# Patient Record
Sex: Male | Born: 1980
Health system: Southern US, Community
[De-identification: ages and names within clinical notes are randomized; demographics above are authoritative.]

## PROBLEM LIST (undated history)

## (undated) DIAGNOSIS — U071 COVID-19: Secondary | ICD-10-CM

## (undated) DIAGNOSIS — K219 Gastro-esophageal reflux disease without esophagitis: Secondary | ICD-10-CM

## (undated) DIAGNOSIS — A071 Giardiasis [lambliasis]: Secondary | ICD-10-CM

## (undated) HISTORY — DX: COVID-19: U07.1

## (undated) HISTORY — PX: PYLOROPLASTY: SHX418

## (undated) HISTORY — PX: UPPER GASTROINTESTINAL ENDOSCOPY: SHX188

## (undated) HISTORY — DX: Giardiasis (lambliasis): A07.1

## (undated) HISTORY — PX: COLONOSCOPY: SHX174

## (undated) HISTORY — PX: NO PAST SURGERIES: SHX2092

## (undated) HISTORY — DX: Gastro-esophageal reflux disease without esophagitis: K21.9

---

## 2019-01-17 ENCOUNTER — Encounter (HOSPITAL_COMMUNITY): Payer: Self-pay | Admitting: Emergency Medicine

## 2019-01-17 ENCOUNTER — Emergency Department (HOSPITAL_COMMUNITY)
Admission: EM | Admit: 2019-01-17 | Discharge: 2019-01-17 | Disposition: A | Discharge status: Home / Self Care | Payer: Self-pay | Attending: Emergency Medicine | Admitting: Emergency Medicine

## 2019-01-17 DIAGNOSIS — J020 Streptococcal pharyngitis: Secondary | ICD-10-CM | POA: Insufficient documentation

## 2019-01-17 DIAGNOSIS — Z20828 Contact with and (suspected) exposure to other viral communicable diseases: Secondary | ICD-10-CM | POA: Insufficient documentation

## 2019-01-17 LAB — LIPASE, BLOOD: Lipase: 28 U/L (ref 11–51)

## 2019-01-17 LAB — COMPREHENSIVE METABOLIC PANEL
ALT: 36 U/L (ref 0–44)
AST: 22 U/L (ref 15–41)
Albumin: 4.4 g/dL (ref 3.5–5.0)
Alkaline Phosphatase: 60 U/L (ref 38–126)
Anion gap: 9 (ref 5–15)
BUN: 14 mg/dL (ref 6–20)
CO2: 27 mmol/L (ref 22–32)
Calcium: 9.4 mg/dL (ref 8.9–10.3)
Chloride: 102 mmol/L (ref 98–111)
Creatinine, Ser: 1.49 mg/dL — ABNORMAL HIGH (ref 0.61–1.24)
GFR calc Af Amer: >60 mL/min (ref >60)
GFR calc non Af Amer: 58 mL/min — ABNORMAL LOW (ref >60)
Glucose, Bld: 90 mg/dL (ref 70–99)
Potassium: 3.9 mmol/L (ref 3.5–5.1)
Sodium: 138 mmol/L (ref 135–145)
Total Bilirubin: 0.6 mg/dL (ref 0.3–1.2)
Total Protein: 8.5 g/dL — ABNORMAL HIGH (ref 6.5–8.1)

## 2019-01-17 LAB — GROUP A STREP BY PCR: Group A Strep by PCR: DETECTED — AB

## 2019-01-17 LAB — CBC
HCT: 50.3 % (ref 39.0–52.0)
Hemoglobin: 16.5 g/dL (ref 13.0–17.0)
MCH: 25.6 pg — ABNORMAL LOW (ref 26.0–34.0)
MCHC: 32.8 g/dL (ref 30.0–36.0)
MCV: 78 fL — ABNORMAL LOW (ref 80.0–100.0)
Platelets: 244 10*3/uL (ref 150–400)
RBC: 6.45 MIL/uL — ABNORMAL HIGH (ref 4.22–5.81)
RDW: 13.4 % (ref 11.5–15.5)
WBC: 14 10*3/uL — ABNORMAL HIGH (ref 4.0–10.5)
nRBC: 0 % (ref 0.0–0.2)

## 2019-01-17 LAB — URINALYSIS, ROUTINE W REFLEX MICROSCOPIC
Bilirubin Urine: NEGATIVE
Glucose, UA: NEGATIVE mg/dL
Ketones, ur: NEGATIVE mg/dL
Leukocytes,Ua: NEGATIVE
Nitrite: NEGATIVE
Protein, ur: 30 mg/dL — AB
Specific Gravity, Urine: 1.02 (ref 1.005–1.030)
pH: 5 (ref 5.0–8.0)

## 2019-01-17 LAB — SARS CORONAVIRUS 2 BY RT PCR (HOSPITAL ORDER, PERFORMED IN ~~LOC~~ HOSPITAL LAB): SARS Coronavirus 2: NEGATIVE

## 2019-01-17 MED ORDER — AZITHROMYCIN 250 MG PO TABS: 250.0000 mg | ORAL_TABLET | Freq: Every day | ORAL | 0 refills | Status: DC

## 2019-01-17 NOTE — Discharge Instructions (Signed)
Please return for any problem.  Follow-up with your regular care provider as instructed.  Drink plenty of fluids.  Cold liquids and foods will help with your throat feel better faster.

## 2019-01-17 NOTE — ED Provider Notes (Signed)
[facility] DEPT Provider Note   CSN: [id number] Arrival date & time: 01/17/19  1244     History   Chief Complaint Chief Complaint  Patient presents with  . Abdominal Pain  . Fever    HPI Jerry Pratt is a 38 y.o. male.     38 year old male with prior medical history as detailed below presents for evaluation of sore throat and fever.  Patient reports onset of symptoms present 48 hours ago.  He denies nausea or vomiting.  He denies shortness of breath or cough.  He reports pain in the posterior throat and mild epigastric discomfort.  He denies urinary symptoms.  He denies change in his bowel movement.  He is not take anything at home for his symptoms.  The history is provided by the patient and medical records. The history is limited by a language barrier. A language interpreter was used.  Fever Temp source:  Subjective Severity:  Mild Onset quality:  Gradual Duration:  2 days Timing:  Constant Progression:  Waxing and waning Chronicity:  New Relieved by:  Nothing Worsened by:  Nothing Ineffective treatments:  None tried Associated symptoms: sore throat   Associated symptoms: no cough and no nausea     History reviewed. No pertinent past medical history.  There are no active problems to display for this patient.   History reviewed. No pertinent surgical history.      Home Medications    Prior to Admission medications   Not on File    Family History No family history on file.  Social History Social History   Tobacco Use  . Smoking status: Not on file  Substance Use Topics  . Alcohol use: Not on file  . Drug use: Not on file     Allergies   Patient has no allergy information on record.   Review of Systems Review of Systems  Constitutional: Positive for fever.  HENT: Positive for sore throat.   Respiratory: Negative for cough.   Gastrointestinal: Negative for nausea.  All other systems reviewed and are  negative.    Physical Exam Updated Vital Signs BP (!) 136/94 (BP Location: Left Arm)   Pulse (!) 121   Temp (!) 101.2 F (38.4 C) (Oral)   Resp 17   SpO2 96%   Physical Exam Vitals signs and nursing note reviewed.  Constitutional:      General: He is not in acute distress.    Appearance: He is well-developed.  HENT:     Head: Normocephalic and atraumatic.     Mouth/Throat:     Mouth: Mucous membranes are moist.     Pharynx: Pharyngeal swelling and oropharyngeal exudate present.     Comments: Mild erythema the posterior pharynx with bilateral tonsillar exudates present.  Uvula midline.  No evidence of PTA. Eyes:     Conjunctiva/sclera: Conjunctivae normal.     Pupils: Pupils are equal, round, and reactive to light.  Neck:     Musculoskeletal: Normal range of motion and neck supple.  Cardiovascular:     Rate and Rhythm: Normal rate and regular rhythm.     Heart sounds: Normal heart sounds.  Pulmonary:     Effort: Pulmonary effort is normal. No respiratory distress.     Breath sounds: Normal breath sounds.  Abdominal:     General: There is no distension.     Palpations: Abdomen is soft.     Tenderness: There is no abdominal tenderness.  Musculoskeletal: Normal range of motion.  General: No deformity.  Skin:    General: Skin is warm and dry.  Neurological:     Mental Status: He is alert and oriented to person, place, and time.      ED Treatments / Results  Labs (all labs ordered are listed, but only abnormal results are displayed) Labs Reviewed  GROUP A STREP BY PCR - Abnormal; Notable for the following components:      Result Value   Group A Strep by PCR DETECTED (*)    All other components within normal limits  COMPREHENSIVE METABOLIC PANEL - Abnormal; Notable for the following components:   Creatinine, Ser 1.49 (*)    Total Protein 8.5 (*)    GFR calc non Af Amer 58 (*)    All other components within normal limits  CBC - Abnormal; Notable for the  following components:   WBC 14.0 (*)    RBC 6.45 (*)    MCV 78.0 (*)    MCH 25.6 (*)    All other components within normal limits  URINALYSIS, ROUTINE W REFLEX MICROSCOPIC - Abnormal; Notable for the following components:   Hgb urine dipstick SMALL (*)    Protein, ur 30 (*)    Bacteria, UA RARE (*)    All other components within normal limits  SARS CORONAVIRUS 2 (HOSPITAL ORDER, [facility])  LIPASE, BLOOD    EKG None  Radiology No results found.  Procedures Procedures (including critical care time)  Medications Ordered in ED Medications - No data to display   Initial Impression / Assessment and Plan / ED Course  I have reviewed the triage vital signs and the nursing notes.  Pertinent labs & imaging results that were available during my care of the patient were reviewed by me and considered in my medical decision making (see chart for details).        MDM  Screen complete  Jerry Pratt was evaluated in Emergency Department on 01/17/2019 for the symptoms described in the history of present illness. He was evaluated in the context of the global COVID-19 pandemic, which necessitated consideration that the patient might be at risk for infection with the SARS-CoV-2 virus that causes COVID-19. Institutional protocols and algorithms that pertain to the evaluation of patients at risk for COVID-19 are in a state of rapid change based on information released by regulatory bodies including the CDC and federal and state organizations. These policies and algorithms were followed during the patient's care in the ED.   Patient is presenting for evaluation of sore throat and fever.  Work-up suggest likely strep pharyngitis with positive rapid strep.  Of note, patient's COVID test is negative.  Other work-up does not reveal significant additional pathology.  Patient does feel improved following his ED evaluation.  He now desires discharge.  Importance of  close follow-up is stressed.  Discharge instructions given in Spanish with translator assistance.  Final Clinical Impressions(s) / ED Diagnoses   Final diagnoses:  Strep pharyngitis    ED Discharge Orders         Ordered    azithromycin (ZITHROMAX) 250 MG tablet  Daily     01/17/19 1737           Valarie Merino, MD 01/17/19 1739

## 2019-01-17 NOTE — ED Triage Notes (Addendum)
Per EMS-states body aches, abdominal pain, fever and sore throat-no SOB-has not been around anyone positive for Covid-spanish speaking-t 101.5, 1000 mg of Tylenol given in route

## 2019-08-21 ENCOUNTER — Emergency Department (HOSPITAL_COMMUNITY)
Admission: EM | Admit: 2019-08-21 | Discharge: 2019-08-21 | Disposition: A | Discharge status: Home Health | Payer: Self-pay | Attending: Emergency Medicine | Admitting: Emergency Medicine

## 2019-08-21 ENCOUNTER — Emergency Department (HOSPITAL_COMMUNITY): Payer: Self-pay

## 2019-08-21 ENCOUNTER — Other Ambulatory Visit: Payer: Self-pay

## 2019-08-21 ENCOUNTER — Encounter (HOSPITAL_COMMUNITY): Payer: Self-pay | Admitting: *Deleted

## 2019-08-21 DIAGNOSIS — W450XXA Nail entering through skin, initial encounter: Secondary | ICD-10-CM | POA: Insufficient documentation

## 2019-08-21 DIAGNOSIS — Z23 Encounter for immunization: Secondary | ICD-10-CM | POA: Insufficient documentation

## 2019-08-21 DIAGNOSIS — M7918 Myalgia, other site: Secondary | ICD-10-CM | POA: Insufficient documentation

## 2019-08-21 DIAGNOSIS — U071 COVID-19: Secondary | ICD-10-CM | POA: Insufficient documentation

## 2019-08-21 DIAGNOSIS — R05 Cough: Secondary | ICD-10-CM | POA: Insufficient documentation

## 2019-08-21 DIAGNOSIS — R1013 Epigastric pain: Secondary | ICD-10-CM | POA: Insufficient documentation

## 2019-08-21 LAB — CBC WITH DIFFERENTIAL/PLATELET
Abs Immature Granulocytes: 0.02 10*3/uL (ref 0.00–0.07)
Basophils Absolute: 0 10*3/uL (ref 0.0–0.1)
Basophils Relative: 1 %
Eosinophils Absolute: 0 10*3/uL (ref 0.0–0.5)
Eosinophils Relative: 0 %
HCT: 51 % (ref 39.0–52.0)
Hemoglobin: 16.8 g/dL (ref 13.0–17.0)
Immature Granulocytes: 0 %
Lymphocytes Relative: 24 %
Lymphs Abs: 1.5 10*3/uL (ref 0.7–4.0)
MCH: 25.5 pg — ABNORMAL LOW (ref 26.0–34.0)
MCHC: 32.9 g/dL (ref 30.0–36.0)
MCV: 77.4 fL — ABNORMAL LOW (ref 80.0–100.0)
Monocytes Absolute: 0.5 10*3/uL (ref 0.1–1.0)
Monocytes Relative: 8 %
Neutro Abs: 4.1 10*3/uL (ref 1.7–7.7)
Neutrophils Relative %: 67 %
Platelets: 200 10*3/uL (ref 150–400)
RBC: 6.59 MIL/uL — ABNORMAL HIGH (ref 4.22–5.81)
RDW: 13.8 % (ref 11.5–15.5)
WBC: 6.1 10*3/uL (ref 4.0–10.5)
nRBC: 0 % (ref 0.0–0.2)

## 2019-08-21 LAB — COMPREHENSIVE METABOLIC PANEL
ALT: 38 U/L (ref 0–44)
AST: 30 U/L (ref 15–41)
Albumin: 4 g/dL (ref 3.5–5.0)
Alkaline Phosphatase: 53 U/L (ref 38–126)
Anion gap: 10 (ref 5–15)
BUN: 12 mg/dL (ref 6–20)
CO2: 24 mmol/L (ref 22–32)
Calcium: 9.4 mg/dL (ref 8.9–10.3)
Chloride: 102 mmol/L (ref 98–111)
Creatinine, Ser: 1.25 mg/dL — ABNORMAL HIGH (ref 0.61–1.24)
GFR calc Af Amer: >60 mL/min (ref >60)
GFR calc non Af Amer: >60 mL/min (ref >60)
Glucose, Bld: 119 mg/dL — ABNORMAL HIGH (ref 70–99)
Potassium: 3.9 mmol/L (ref 3.5–5.1)
Sodium: 136 mmol/L (ref 135–145)
Total Bilirubin: 0.5 mg/dL (ref 0.3–1.2)
Total Protein: 7.9 g/dL (ref 6.5–8.1)

## 2019-08-21 LAB — LACTIC ACID, PLASMA: Lactic Acid, Venous: 1.6 mmol/L (ref 0.5–1.9)

## 2019-08-21 LAB — POC SARS CORONAVIRUS 2 AG: SARS Coronavirus 2 Ag: POSITIVE — AB

## 2019-08-21 MED ORDER — KETOROLAC TROMETHAMINE 30 MG/ML IJ SOLN
15.0000 mg | Freq: Once | INTRAMUSCULAR | Status: AC
  Administered 2019-08-21: 15 mg via INTRAVENOUS
  Filled 2019-08-21: qty 1

## 2019-08-21 MED ORDER — SODIUM CHLORIDE 0.9 % IV BOLUS
1000.0000 mL | Freq: Once | INTRAVENOUS | Status: AC
  Administered 2019-08-21: 1000 mL via INTRAVENOUS

## 2019-08-21 MED ORDER — ACETAMINOPHEN 325 MG PO TABS
650.0000 mg | ORAL_TABLET | Freq: Once | ORAL | Status: AC | PRN
  Administered 2019-08-21: 650 mg via ORAL
  Filled 2019-08-21: qty 2

## 2019-08-21 MED ORDER — SODIUM CHLORIDE 0.9% FLUSH: 3.0000 mL | Freq: Once | INTRAVENOUS | Status: DC

## 2019-08-21 MED ORDER — TETANUS-DIPHTH-ACELL PERTUSSIS 5-2.5-18.5 LF-MCG/0.5 IM SUSP
0.5000 mL | Freq: Once | INTRAMUSCULAR | Status: AC
  Administered 2019-08-21: 0.5 mL via INTRAMUSCULAR
  Filled 2019-08-21: qty 0.5

## 2019-08-21 NOTE — ED Provider Notes (Signed)
[facility] DEPT Provider Note   CSN: [id number] Arrival date & time: 08/21/19  0136     History No chief complaint on file.   Bernett Komara is a 39 y.o. male.  HPI    Patient presents with concern of fever, cough, diffuse discomfort, and epigastric pain. He notes that he is generally well though he does have a history of back disease, notes that he has been identified with disc issues, back in the [place]  He immigrated here about 4 years ago, works as a Theme park manager.  Patient symptoms began about 1 week ago, about the time he stepped on a nail.  He has no persistent shakes, no rigor, last tetanus may have been about 6 years ago. He now presents due to 1 week of ongoing fever, cough discomfort, epigastric pain as above.  There is no vomiting, no other abdominal pain.  Patient has had some improvement with Tylenol, though it is transient improvement of his fever more than anything.  History reviewed. No pertinent past medical history.  There are no problems to display for this patient.   History reviewed. No pertinent surgical history.     No family history on file.  Social History   Tobacco Use  . Smoking status: Not on file  Substance Use Topics  . Alcohol use: Not on file  . Drug use: Not on file    Home Medications Prior to Admission medications   Medication Sig Start Date End Date Taking? Authorizing Provider  azithromycin (ZITHROMAX) 250 MG tablet Take 1 tablet (250 mg total) by mouth daily. Take first 2 tablets together, then 1 every day until finished. 01/17/19   Valarie Merino, MD    Allergies    Patient has no allergy information on record.  Review of Systems   Review of Systems  Constitutional:       Per HPI, otherwise negative  HENT:       Per HPI, otherwise negative  Respiratory:       Per HPI, otherwise negative  Cardiovascular:       Per HPI, otherwise negative  Gastrointestinal: Negative for vomiting.    Endocrine:       Negative aside from HPI  Genitourinary:       Neg aside from HPI   Musculoskeletal:       Per HPI, otherwise negative  Skin: Negative.   Neurological: Negative for syncope.    Physical Exam Updated Vital Signs BP (!) 138/107 (BP Location: Right Arm)   Pulse 99   Temp (!) 100.4 F (38 C) (Oral)   Resp (!) 22   SpO2 96%   Physical Exam Vitals and nursing note reviewed.  Constitutional:      General: He is not in acute distress.    Appearance: He is well-developed.  HENT:     Head: Normocephalic and atraumatic.  Eyes:     Conjunctiva/sclera: Conjunctivae normal.  Cardiovascular:     Rate and Rhythm: Normal rate and regular rhythm.  Pulmonary:     Effort: Pulmonary effort is normal. No respiratory distress.     Breath sounds: No stridor.  Abdominal:     General: There is no distension.  Skin:    General: Skin is warm and dry.  Neurological:     Mental Status: He is alert and oriented to person, place, and time.     ED Results / Procedures / Treatments   Labs (all labs ordered are listed, but only abnormal  results are displayed) Labs Reviewed  COMPREHENSIVE METABOLIC PANEL - Abnormal; Notable for the following components:      Result Value   Glucose, Bld 119 (*)    Creatinine, Ser 1.25 (*)    All other components within normal limits  CBC WITH DIFFERENTIAL/PLATELET - Abnormal; Notable for the following components:   RBC 6.59 (*)    MCV 77.4 (*)    MCH 25.5 (*)    All other components within normal limits  POC SARS CORONAVIRUS 2 AG - Abnormal; Notable for the following components:   SARS Coronavirus 2 Ag POSITIVE (*)    All other components within normal limits  LACTIC ACID, PLASMA  URINALYSIS, ROUTINE W REFLEX MICROSCOPIC  POC SARS CORONAVIRUS 2 AG -  ED    Radiology DG Chest Portable 1 View  Result Date: 08/21/2019 CLINICAL DATA:  Cough and fever for 3 days EXAM: PORTABLE CHEST 1 VIEW COMPARISON:  None. FINDINGS: Single frontal view of  the chest demonstrates an unremarkable cardiac silhouette. Lung volumes are slightly diminished. No airspace disease, effusion, or pneumothorax. No acute bony abnormality. IMPRESSION: 1. Low lung volumes, no acute process. Electronically Signed   By: Randa Ngo M.D.   On: 08/21/2019 02:18    Procedures Procedures (including critical care time)  Medications Ordered in ED Medications  sodium chloride 0.9 % bolus 1,000 mL (has no administration in time range)  ketorolac (TORADOL) 30 MG/ML injection 15 mg (has no administration in time range)  Tdap (BOOSTRIX) injection 0.5 mL (has no administration in time range)  acetaminophen (TYLENOL) tablet 650 mg (650 mg Oral Given 08/21/19 0232)    ED Course  I have reviewed the triage vital signs and the nursing notes.  Pertinent labs & imaging results that were available during my care of the patient were reviewed by me and considered in my medical decision making (see chart for details).   With consideration of pneumonia, SIRS, sepsis, tetanus, labs, IV fluids, Toradol, tetanus updating all ordered after my initial evaluation.  Update: Patient in no distress, findings discussed again.  MDM Rules/Calculators/A&P This generally well 39 year old male presents with signs and symptoms consistent with Covid infection. Patient is awake and alert, but has initial tachycardia, is found to have slight elevated creatinine, consistent with ongoing infection.  No new oxygen requirement, no indication for admission.  In addition to the patient's coronavirus infection, there is some concern for his systemic and male, though no evidence for tetanus.  Patient did receive updating for this as well. Final Clinical Impression(s) / ED Diagnoses Final diagnoses:  COVID-19 virus infection     Carmin Muskrat, MD 08/21/19 1112

## 2019-08-21 NOTE — ED Triage Notes (Signed)
Via spanish interpreter. Pt says that he stepped on a nail at work (last Tuesday), and just after that he started with a fever and pain all over. 3 days ago, he has been feeling worse, with fevers, back pain,cough, headache. Has been taking tylenol. Denies sick contacts.

## 2019-08-21 NOTE — ED Triage Notes (Signed)
Pt arrives via [facility] from home. Stepped on a nail with his left foot, one week ago. Now having fever, body aches and chills. No obvious injury, swelling or redness to the foot. Limited English. A/O x 4. 160palpated, hr 110, sp 97% RA, CBG 143, temp 98.2.

## 2019-08-28 ENCOUNTER — Telehealth: Payer: Self-pay | Admitting: *Deleted

## 2019-08-28 NOTE — Telephone Encounter (Signed)
Patient calls with Spanish interpreter on the line. He was tested Covid positive on 08/21/19 but had symptoms 9 days prior to that date. He no longer has symptoms other than an "occasional cough". He would like to be retested to know he no longer can spread the virus. Provided the patient with the testing site address and # to schedule an appointment.

## 2020-04-25 ENCOUNTER — Emergency Department (HOSPITAL_COMMUNITY): Payer: No Typology Code available for payment source

## 2020-04-25 ENCOUNTER — Encounter (HOSPITAL_COMMUNITY): Payer: Self-pay | Admitting: Emergency Medicine

## 2020-04-25 ENCOUNTER — Emergency Department (HOSPITAL_COMMUNITY)
Admission: EM | Admit: 2020-04-25 | Discharge: 2020-04-26 | Disposition: A | Discharge status: Home / Self Care | Payer: No Typology Code available for payment source | Attending: Emergency Medicine | Admitting: Emergency Medicine

## 2020-04-25 ENCOUNTER — Other Ambulatory Visit: Payer: Self-pay

## 2020-04-25 DIAGNOSIS — R519 Headache, unspecified: Secondary | ICD-10-CM | POA: Diagnosis not present

## 2020-04-25 DIAGNOSIS — M25522 Pain in left elbow: Secondary | ICD-10-CM | POA: Diagnosis not present

## 2020-04-25 DIAGNOSIS — M25552 Pain in left hip: Secondary | ICD-10-CM | POA: Diagnosis present

## 2020-04-25 DIAGNOSIS — M545 Low back pain, unspecified: Secondary | ICD-10-CM | POA: Insufficient documentation

## 2020-04-25 DIAGNOSIS — Y92410 Unspecified street and highway as the place of occurrence of the external cause: Secondary | ICD-10-CM | POA: Insufficient documentation

## 2020-04-25 DIAGNOSIS — M79632 Pain in left forearm: Secondary | ICD-10-CM | POA: Insufficient documentation

## 2020-04-25 MED ORDER — OXYCODONE-ACETAMINOPHEN 5-325 MG PO TABS
1.0000 | ORAL_TABLET | Freq: Once | ORAL | Status: AC
  Administered 2020-04-25: 1 via ORAL
  Filled 2020-04-25: qty 1
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
## 2020-04-26 MED ORDER — IBUPROFEN 800 MG PO TABS
800.0000 mg | ORAL_TABLET | Freq: Once | ORAL | Status: AC
  Administered 2020-04-26: 04:00:00 800 mg via ORAL
  Filled 2020-04-26: qty 1

## 2020-04-26 NOTE — Discharge Instructions (Signed)
You may alternate Tylenol 1000 mg every 6 hours as needed for pain, fever and Ibuprofen 800 mg every 8 hours as needed for pain, fever.  Please take Ibuprofen with food.  Do not take more than 4000 mg of Tylenol (acetaminophen) in a 24 hour period.   Steps to find a Primary Care Provider (PCP):  Call [phone] or [phone] to access "[facility]."  2.  You may also go on the [facility] website at [email or web address]  3.  [facility] also frequently accepts new patients.  [place] [phone]  4.  There are also multiple [facility], [facility] and [facility] practices throughout the [place] that are frequently accepting new patients. You may find a clinic that is close to your home and contact them.  [email or web address] [phone]  [facility] [email or web address]  [email or web address] [place] [email or web address] [phone]  5.  Local Health Departments also can provide primary care services.  [facility], [place] [address] [place] [facility] [place] [address] [place] Department [place] [phone]
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
## 2020-07-15 NOTE — Progress Notes (Signed)
Opened in error - please disregard

## 2021-02-18 ENCOUNTER — Other Ambulatory Visit: Payer: Self-pay

## 2021-02-18 ENCOUNTER — Ambulatory Visit
Admission: RE | Admit: 2021-02-18 | Discharge: 2021-02-18 | Disposition: A | Discharge status: Home / Self Care | Payer: 59 | Source: Ambulatory Visit | Attending: Internal Medicine | Admitting: Internal Medicine

## 2021-02-18 VITALS — BP 152/105 | HR 81 | Temp 98.0°F | Resp 18 | Ht 71.0 in | Wt 220.0 lb

## 2021-02-18 DIAGNOSIS — K219 Gastro-esophageal reflux disease without esophagitis: Secondary | ICD-10-CM

## 2021-02-18 MED ORDER — OMEPRAZOLE 20 MG PO CPDR: 20.0000 mg | DELAYED_RELEASE_CAPSULE | Freq: Every day | ORAL | 0 refills | Status: DC

## 2021-02-18 MED ORDER — FAMOTIDINE 20 MG PO TABS: 20.0000 mg | ORAL_TABLET | Freq: Every day | ORAL | 0 refills | Status: DC

## 2021-02-18 NOTE — ED Triage Notes (Signed)
Patient c/o stomach pain that causes a burning sensation in his chest and throat for several months.  Patient is taken Omeprazole for the sx's, some relief.  Some nausea and some vomiting.

## 2021-02-18 NOTE — Discharge Instructions (Addendum)
You have been prescribed two daily medications to help alleviate symptoms.  Please follow-up with provided contact information for gastroenterology specialist for further evaluation and management.

## 2021-02-18 NOTE — ED Provider Notes (Signed)
[facility]    CSN: [id number] Arrival date & time: 02/18/21  1507      History   Chief Complaint Chief Complaint  Patient presents with   Appointment    Stomach Pain    HPI Jerry Pratt is a 40 y.o. male.   Patient presents with stomach pain that has been intermittent for the past 2 to 3 months.  Pain is described as burning sensation that occurs at the top of the throat and radiates down to the epigastric area.  Pain is intermittent and is not severe.  Patient denies nausea, vomiting, diarrhea.  Denies any blood in stool.  Patient having regular bowel movements and states that he has a bowel movement every time he consumes a meal.  Denies any fevers.  Patient reports that he has been taking omeprazole as needed that provides relief of symptoms.  Denies weight loss or loss of appetite.    History reviewed. No pertinent past medical history.  There are no problems to display for this patient.   History reviewed. No pertinent surgical history.     Home Medications    Prior to Admission medications   Medication Sig Start Date End Date Taking? Authorizing Provider  famotidine (PEPCID) 20 MG tablet Take 1 tablet (20 mg total) by mouth at bedtime. 02/18/21  Yes Odis Luster, FNP  omeprazole (PRILOSEC) 20 MG capsule Take 1 capsule (20 mg total) by mouth daily. 02/18/21  Yes Odis Luster, FNP  azithromycin (ZITHROMAX) 250 MG tablet Take 1 tablet (250 mg total) by mouth daily. Take first 2 tablets together, then 1 every day until finished. 01/17/19   Valarie Merino, MD    Family History No family history on file.  Social History Social History   Tobacco Use   Smoking status: Never   Smokeless tobacco: Never  Substance Use Topics   Alcohol use: Never   Drug use: Never     Allergies   Patient has no known allergies.   Review of Systems Review of Systems Per HPI  Physical Exam Triage Vital Signs ED Triage Vitals  Enc Vitals Group     BP  02/18/21 1545 (!) 152/105     Pulse Rate 02/18/21 1545 81     Resp 02/18/21 1545 18     Temp 02/18/21 1545 98 F (36.7 C)     Temp Source 02/18/21 1545 Oral     SpO2 02/18/21 1545 97 %     Weight 02/18/21 1547 220 lb (99.8 kg)     Height 02/18/21 1547 5\' 11"  (1.803 m)     Head Circumference --      Peak Flow --      Pain Score 02/18/21 1546 6     Pain Loc --      Pain Edu? --      Excl. in [place]? --    No data found.  Updated Vital Signs BP (!) 152/105 (BP Location: Left Arm)   Pulse 81   Temp 98 F (36.7 C) (Oral)   Resp 18   Ht 5\' 11"  (1.803 m)   Wt 220 lb (99.8 kg)   SpO2 97%   BMI 30.68 kg/m   Visual Acuity Right Eye Distance:   Left Eye Distance:   Bilateral Distance:    Right Eye Near:   Left Eye Near:    Bilateral Near:     Physical Exam Constitutional:      General: He is not in acute distress.  Appearance: Normal appearance. He is not toxic-appearing or diaphoretic.  HENT:     Head: Normocephalic and atraumatic.  Eyes:     Extraocular Movements: Extraocular movements intact.     Conjunctiva/sclera: Conjunctivae normal.  Cardiovascular:     Rate and Rhythm: Normal rate and regular rhythm.     Pulses: Normal pulses.     Heart sounds: Normal heart sounds.  Pulmonary:     Effort: Pulmonary effort is normal. No respiratory distress.     Breath sounds: Normal breath sounds.  Abdominal:     General: Bowel sounds are normal. There is no distension.     Palpations: Abdomen is soft.     Tenderness: There is abdominal tenderness in the epigastric area. There is guarding. Negative signs include Murphy's sign, Rovsing's sign, McBurney's sign, psoas sign and obturator sign.  Skin:    General: Skin is warm and dry.  Neurological:     General: No focal deficit present.     Mental Status: He is alert and oriented to person, place, and time. Mental status is at baseline.  Psychiatric:        Mood and Affect: Mood normal.        Behavior: Behavior normal.         Thought Content: Thought content normal.        Judgment: Judgment normal.     UC Treatments / Results  Labs (all labs ordered are listed, but only abnormal results are displayed) Labs Reviewed - No data to display  EKG   Radiology No results found.  Procedures Procedures (including critical care time)  Medications Ordered in UC Medications - No data to display  Initial Impression / Assessment and Plan / UC Course  I have reviewed the triage vital signs and the nursing notes.  Pertinent labs & imaging results that were available during my care of the patient were reviewed by me and considered in my medical decision making (see chart for details).     Patient symptoms and physical exam seem most consistent with acid reflux.  Advised patient that omeprazole will need to be taken daily for preventive medication.  Patient prescribed omeprazole 20 mg daily.  Also prescribed famotidine to take at night at bedtime to help alleviate symptoms as well.  Patient to avoid foods that are irritating.  Patient to follow-up with provided contact information for GI specialist for further evaluation and management.  PCP assistance requested for patient as well due to patient request.Discussed strict return precautions. Patient verbalized understanding and is agreeable with plan.  Final Clinical Impressions(s) / UC Diagnoses   Final diagnoses:  Gastroesophageal reflux disease, unspecified whether esophagitis present     Discharge Instructions      You have been prescribed two daily medications to help alleviate symptoms.  Please follow-up with provided contact information for gastroenterology specialist for further evaluation and management.     ED Prescriptions     Medication Sig Dispense Auth. Provider   omeprazole (PRILOSEC) 20 MG capsule Take 1 capsule (20 mg total) by mouth daily. 90 capsule Odis Luster, FNP   famotidine (PEPCID) 20 MG tablet Take 1 tablet (20 mg total) by  mouth at bedtime. 30 tablet Odis Luster, FNP      PDMP not reviewed this encounter.   Odis Luster, [place] 02/18/21 [phone]

## 2021-03-01 ENCOUNTER — Ambulatory Visit (INDEPENDENT_AMBULATORY_CARE_PROVIDER_SITE_OTHER): Payer: 59 | Admitting: Gastroenterology

## 2021-03-01 ENCOUNTER — Other Ambulatory Visit (INDEPENDENT_AMBULATORY_CARE_PROVIDER_SITE_OTHER): Payer: 59

## 2021-03-01 ENCOUNTER — Encounter: Payer: Self-pay | Admitting: Gastroenterology

## 2021-03-01 VITALS — BP 120/90 | HR 92 | Ht 68.0 in | Wt 218.5 lb

## 2021-03-01 DIAGNOSIS — K625 Hemorrhage of anus and rectum: Secondary | ICD-10-CM

## 2021-03-01 DIAGNOSIS — K529 Noninfective gastroenteritis and colitis, unspecified: Secondary | ICD-10-CM

## 2021-03-01 DIAGNOSIS — K219 Gastro-esophageal reflux disease without esophagitis: Secondary | ICD-10-CM

## 2021-03-01 DIAGNOSIS — R718 Other abnormality of red blood cells: Secondary | ICD-10-CM

## 2021-03-01 DIAGNOSIS — R1011 Right upper quadrant pain: Secondary | ICD-10-CM

## 2021-03-01 DIAGNOSIS — G8929 Other chronic pain: Secondary | ICD-10-CM

## 2021-03-01 LAB — COMPREHENSIVE METABOLIC PANEL
ALT: 31 U/L (ref 0–53)
AST: 32 U/L (ref 0–37)
Albumin: 4.2 g/dL (ref 3.5–5.2)
Alkaline Phosphatase: 53 U/L (ref 39–117)
BUN: 13 mg/dL (ref 6–23)
CO2: 28 mEq/L (ref 19–32)
Calcium: 9.3 mg/dL (ref 8.4–10.5)
Chloride: 103 mEq/L (ref 96–112)
Creatinine, Ser: 1.29 mg/dL (ref 0.40–1.50)
GFR: 68.26 mL/min (ref >60.00)
Glucose, Bld: 85 mg/dL (ref 70–99)
Potassium: 3.4 mEq/L — ABNORMAL LOW (ref 3.5–5.1)
Sodium: 139 mEq/L (ref 135–145)
Total Bilirubin: 0.6 mg/dL (ref 0.2–1.2)
Total Protein: 7.3 g/dL (ref 6.0–8.3)

## 2021-03-01 LAB — CBC WITH DIFFERENTIAL/PLATELET
Basophils Absolute: 0.1 10*3/uL (ref 0.0–0.1)
Basophils Relative: 1.1 % (ref 0.0–3.0)
Eosinophils Absolute: 0.3 10*3/uL (ref 0.0–0.7)
Eosinophils Relative: 2.9 % (ref 0.0–5.0)
HCT: 46.7 % (ref 39.0–52.0)
Hemoglobin: 15.4 g/dL (ref 13.0–17.0)
Lymphocytes Relative: 36.8 % (ref 12.0–46.0)
Lymphs Abs: 3.3 10*3/uL (ref 0.7–4.0)
MCHC: 33 g/dL (ref 30.0–36.0)
MCV: 77.8 fl — ABNORMAL LOW (ref 78.0–100.0)
Monocytes Absolute: 1 10*3/uL (ref 0.1–1.0)
Monocytes Relative: 11.3 % (ref 3.0–12.0)
Neutro Abs: 4.3 10*3/uL (ref 1.4–7.7)
Neutrophils Relative %: 47.9 % (ref 43.0–77.0)
Platelets: 248 10*3/uL (ref 150.0–400.0)
RBC: 6 Mil/uL — ABNORMAL HIGH (ref 4.22–5.81)
RDW: 13.6 % (ref 11.5–15.5)
WBC: 8.9 10*3/uL (ref 4.0–10.5)

## 2021-03-01 LAB — LIPASE: Lipase: 43 U/L (ref 11.0–59.0)

## 2021-03-01 NOTE — Patient Instructions (Signed)
It was a pleasure to meet you today. I have recommended:  - Please stop by the lab for testing today - Increase your omeprazole to 40 mg twice daily - You may continue to use your famotidine 20 mg at bedtime if needed - I gave you a brochure about ways to minimize reflux - Avoid foods that may trigger your symptoms - Schedule an upper endoscopy and colonoscopy for further evaluation - I will refer you to a primary care provider to establish care  LABS:   Please proceed to the basement level for lab work before leaving today. Press "B" on the elevator. The lab is located at the first door on the left as you exit the elevator.  HEALTHCARE LAWS AND MY CHART RESULTS:   Due to recent changes in healthcare laws, you may see results of your imaging and/or laboratory studies on MyChart before I have had a chance to review them.  I understand that in some cases there may be results that are confusing or concerning to you. Please understand that not all results are received at same time and often I may need to interpret multiple results in order to provide you with the best plan of care or course of treatment. Therefore, I ask that you please give me 48 hours to thoroughly review all your results before contacting my office for clarification.    COLONOSCOPY AND ENDOSCOPY:   You have been scheduled for an endoscopy and a colonoscopy. Please follow the written instructions given to you at your visit today.  PREP:   Please pick up your prep supplies at the pharmacy within the next 1-3 days.  INHALERS:   If you use inhalers (even only as needed), please bring them with you on the day of your procedure.   COLONOSCOPY TIPS:  To reduce nausea and dehydration, stay well hydrated for 3-4 days prior to the exam.  To prevent skin/hemorrhoid irritation - prior to wiping, put A&Dointment or vaseline on the toilet paper. Keep a towel or pad on the bed.  BEFORE STARTING YOUR PREP, drink  64oz of clear  liquids in the morning. This will help to flush the colon and will ensure you are well hydrated!!!!  NOTE - This is in addition to the fluids required for to complete your prep. Use of a flavored hard candy, such as grape Anise Salvo, can counteract some of the flavor of the prep and may prevent some nausea.  It was my pleasure to provide care to you today. Based on our discussion, I am providing you with my recommendations below:  RECOMMENDATION(S):    FOLLOW UP:  After your procedure, you will receive a call from my office staff regarding my recommendation for follow up.  BMI:  If you are age 51 or older, your body mass index should be between 23-30. Your Body mass index is 33.22 kg/m. If this is out of the aforementioned range listed, please consider follow up with your Primary Care Provider.  If you are age 71 or younger, your body mass index should be between 19-25. Your Body mass index is 33.22 kg/m. If this is out of the aformentioned range listed, please consider follow up with your Primary Care Provider.   MY CHART:  The [facility] providers would like to encourage you to use [facility] to communicate with providers for non-urgent requests or questions.  Due to long hold times on the telephone, sending your provider a message by [facility] may be a faster  and more efficient way to get a response.  Please allow 48 business hours for a response.  Please remember that this is for non-urgent requests.   Thank you for trusting me with your gastrointestinal care!    Thornton Park, MD, MPH

## 2021-03-01 NOTE — Progress Notes (Signed)
Referring Provider: No ref. provider found Primary Care Physician:  Patient, No Pcp Per (Inactive)   Reason for Consultation:  reflux, abdominal pain   IMPRESSION:  Upper abdominal pain Reflux with associated globus Postprandial diarrhea - having 7 bowel movements daily Bright red blood noted on the toilet paper Elevated creatinine on last labs in April 2021 Microcytosis without anemia on labs in April 2021 Family history of stomach issues ? Gastric cancer (mother)  The differential for upper abdominal pain with associated reflux and post-prandial diarrhea includes esophagitis, gastritis, H pylori, gastroparesis, duodenitis, functional abdominal pain, SIBO, celiac, IBD, pancreatic and/or hepatobiliary disorders. The association with stress makes increases the likelihood for a component that is function.   PLAN: - CMP, lipase and CBC - Increase omeprazole to 40 mg BID - Continue famotidine 20 mg nightly - Reflux lifestyle modifications - EGD with biopsies and colonoscopy with evaluation of the TI - Abdominal imaging if endoscopic evaluation is negative - Establish care with primary care - ? Depression  I consented the patient at the bedside today discussing the risks, benefits, and alternatives to endoscopic evaluation. He acknowledged these risks and asked that we proceed.    HPI: Jerry Pratt is a 40 y.o. male referred by the urgent care for abdominal pain.  The history is obtained to the patient with the help of interpreter and review of his electronic health record. He does not have a primary care provider. 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   Seen in the Urgent Care 02/19/2020 with a years-long history of intermittent stomach pain described as a burning sensation that occurs at the top of the throat radiating down to the epigastrium, a fullness in the throat, and upper abdominal pain.  No labs or imaging were performed at that time of the Urgent Care  visit.  He was discharged on omeprazole 20 mg daily and famotidine 20 mg nightly. Symptoms are unchanged despite 100% adherence with that medical regimen.   He is also reporting many years of postprandial diarrhea and the new onset of intermittent BRB on the toilet paper.  He will have a bowel movement before he even finishes eating. He is having 7-8 bowel movements daily. 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   No evidence for GI bleeding, iron deficiency anemia, anorexia, unexplained weight loss, dysphagia, odynophagia, persistent vomiting, or gastrointestinal cancer in a first-degree relative.   He notes some personal issues and some work issues. Stress may be worsening his symptoms.   Uses Tylenol. No NSAIDs. Recently on an "antibiotic"  for the flu that gets from [employer].   Most recent labs from 08/2019 show an abnormal CMP with a creatinine of 1.25 and a glucose of 119, MCV 77.4, hemoglobin 16.8, RDW 13.8, platelets 200  No prior abdominal imaging.  No prior endoscopic evaluation.  Mother died of stomach related problems. No family history of gastrointestinal cancer in a first-degree relative.  Past Medical History:  Diagnosis Date   COVID-19    GERD (gastroesophageal reflux disease)    Giardia     Past Surgical History:  Procedure Laterality Date   NO PAST SURGERIES       Current Outpatient Medications  Medication Sig Dispense Refill   famotidine (PEPCID) 20 MG tablet Take 1 tablet (20 mg total) by mouth at bedtime. 30 tablet 0   omeprazole (PRILOSEC) 20 MG capsule Take 1 capsule (20 mg total) by mouth daily. 90 capsule 0   No current facility-administered  medications for this visit.    Allergies as of 03/01/2021   (No Known Allergies)     Review of Systems: 12 system ROS is negative except as noted above.   Physical Exam: General:   Alert,  well-nourished, pleasant and cooperative in NAD Head:  Normocephalic and atraumatic. Eyes:  Sclera clear,  no icterus.   Conjunctiva pink. Ears:  Normal auditory acuity. Nose:  No deformity, discharge,  or lesions. Mouth:  No deformity or lesions.   Neck:  Supple; no masses or thyromegaly. Lungs:  Clear throughout to auscultation.   No wheezes. Heart:  Regular rate and rhythm; no murmurs. Abdomen:  Soft, mild upper abdominal pain, nondistended, normal bowel sounds, no rebound or guarding. No hepatosplenomegaly.   Rectal:  Deferred  Msk:  Symmetrical. No boney deformities LAD: No inguinal or umbilical LAD Extremities:  No clubbing or edema. Neurologic:  Alert and  oriented x4;  grossly nonfocal Skin:  Intact without significant lesions or rashes. Psych:  Alert and cooperative. Normal mood and affect.    Khyla Mccumbers L. Tarri Glenn, MD, MPH 03/01/2021, 8:11 PM

## 2021-03-02 ENCOUNTER — Other Ambulatory Visit: Payer: Self-pay

## 2021-03-02 DIAGNOSIS — E876 Hypokalemia: Secondary | ICD-10-CM

## 2021-03-02 DIAGNOSIS — K625 Hemorrhage of anus and rectum: Secondary | ICD-10-CM

## 2021-03-02 DIAGNOSIS — R718 Other abnormality of red blood cells: Secondary | ICD-10-CM

## 2021-03-02 MED ORDER — POTASSIUM CHLORIDE CRYS ER 10 MEQ PO TBCR: 10.0000 meq | EXTENDED_RELEASE_TABLET | Freq: Two times a day (BID) | ORAL | 0 refills | Status: DC

## 2021-03-03 ENCOUNTER — Other Ambulatory Visit: Payer: Self-pay | Admitting: Gastroenterology

## 2021-03-03 DIAGNOSIS — E876 Hypokalemia: Secondary | ICD-10-CM

## 2021-04-08 ENCOUNTER — Ambulatory Visit (AMBULATORY_SURGERY_CENTER): Payer: 59 | Admitting: Gastroenterology

## 2021-04-08 ENCOUNTER — Encounter: Payer: Self-pay | Admitting: Gastroenterology

## 2021-04-08 VITALS — BP 113/77 | HR 95 | Temp 98.3°F | Resp 23 | Ht 68.0 in | Wt 218.0 lb

## 2021-04-08 DIAGNOSIS — D128 Benign neoplasm of rectum: Secondary | ICD-10-CM | POA: Diagnosis not present

## 2021-04-08 DIAGNOSIS — K625 Hemorrhage of anus and rectum: Secondary | ICD-10-CM | POA: Diagnosis not present

## 2021-04-08 DIAGNOSIS — K219 Gastro-esophageal reflux disease without esophagitis: Secondary | ICD-10-CM | POA: Diagnosis not present

## 2021-04-08 DIAGNOSIS — K5730 Diverticulosis of large intestine without perforation or abscess without bleeding: Secondary | ICD-10-CM | POA: Diagnosis not present

## 2021-04-08 DIAGNOSIS — R1011 Right upper quadrant pain: Secondary | ICD-10-CM | POA: Diagnosis not present

## 2021-04-08 DIAGNOSIS — R197 Diarrhea, unspecified: Secondary | ICD-10-CM

## 2021-04-08 DIAGNOSIS — D122 Benign neoplasm of ascending colon: Secondary | ICD-10-CM

## 2021-04-08 DIAGNOSIS — K209 Esophagitis, unspecified without bleeding: Secondary | ICD-10-CM

## 2021-04-08 DIAGNOSIS — G8929 Other chronic pain: Secondary | ICD-10-CM

## 2021-04-08 DIAGNOSIS — B9681 Helicobacter pylori [H. pylori] as the cause of diseases classified elsewhere: Secondary | ICD-10-CM

## 2021-04-08 DIAGNOSIS — R718 Other abnormality of red blood cells: Secondary | ICD-10-CM

## 2021-04-08 DIAGNOSIS — K529 Noninfective gastroenteritis and colitis, unspecified: Secondary | ICD-10-CM

## 2021-04-08 DIAGNOSIS — K2951 Unspecified chronic gastritis with bleeding: Secondary | ICD-10-CM | POA: Diagnosis not present

## 2021-04-08 MED ORDER — SODIUM CHLORIDE 0.9 % IV SOLN: 500.0000 mL | Freq: Once | INTRAVENOUS | Status: DC

## 2021-04-08 NOTE — Progress Notes (Signed)
To pacu, VSS. Report to [email or web address] 

## 2021-04-08 NOTE — Op Note (Signed)
[facility] Patient Name: Jerry Pratt Procedure Date: 04/08/2021 2:30 PM MRN: [id number] Endoscopist: Thornton Park MD, MD Age: 40 Referring MD:  Date of Birth: Apr 21, 1981 Gender: Male Account #: [id number] Procedure:                Colonoscopy Indications:              Upper abdominal pain                           Postprandial diarrhea - having 7 bowel movements                            daily                           Bright red blood noted on the toilet paper                           Microcytosis without anemia on labs in April 2021 Medicines:                Monitored Anesthesia Care Procedure:                Pre-Anesthesia Assessment:                           - Prior to the procedure, a History and Physical                            was performed, and patient medications and                            allergies were reviewed. The patient's tolerance of                            previous anesthesia was also reviewed. The risks                            and benefits of the procedure and the sedation                            options and risks were discussed with the patient.                            All questions were answered, and informed consent                            was obtained. Prior Anticoagulants: The patient has                            taken no previous anticoagulant or antiplatelet                            agents. ASA Grade Assessment: II - A patient with  mild systemic disease. After reviewing the risks                            and benefits, the patient was deemed in                            satisfactory condition to undergo the procedure.                           After obtaining informed consent, the colonoscope                            was passed under direct vision. Throughout the                            procedure, the patient's blood pressure, pulse, and                            oxygen  saturations were monitored continuously. The                            CF [id number] #[id number] was introduced through the anus                            and advanced to the 3 cm into the ileum. A second                            forward view of the right colon was performed. The                            colonoscopy was performed without difficulty. The                            patient tolerated the procedure well. The quality                            of the bowel preparation was good. The terminal                            ileum, ileocecal valve, appendiceal orifice, and                            rectum were photographed. Scope In: 2:59:00 PM Scope Out: 3:12:35 PM Scope Withdrawal Time: 0 hours 12 minutes 1 second  Total Procedure Duration: 0 hours 13 minutes 35 seconds  Findings:                 The perianal and digital rectal examinations were                            normal except for internal hemorrhoids.                           A 2 mm polyp was found in the  rectum. The polyp was                            sessile. The polyp was removed with a cold snare.                            Resection and retrieval were complete. Estimated                            blood loss was minimal.                           A 2 mm polyp was found in the ascending colon. The                            polyp was flat. The polyp was removed with a cold                            snare. Resection and retrieval were complete.                            Estimated blood loss was minimal.                           Multiple small and large-mouthed diverticula were                            found in the sigmoid colon and descending colon.                           The remainder of the examined colonic mucosa                            appeared normal. Biopsies were taken from the right                            colong and left colon with a cold forceps for                            histology.  Estimated blood loss was minimal.                           The terminal ileum appeared normal. Complications:            No immediate complications. Estimated blood loss:                            Minimal. Estimated Blood Loss:     Estimated blood loss was minimal. Impression:               - Internal hemorrhoids.                           - One 2 mm polyp in the rectum, removed with a cold  snare. Resected and retrieved.                           - One 2 mm polyp in the ascending colon, removed                            with a cold snare. Resected and retrieved.                           - The entire examined colon is normal. Biopsied.                           - The examined portion of the ileum was normal.                           - The examination was otherwise normal on direct                            and retroflexion views. Recommendation:           - Patient has a contact number available for                            emergencies. The signs and symptoms of potential                            delayed complications were discussed with the                            patient. Return to normal activities tomorrow.                            Written discharge instructions were provided to the                            patient.                           - Resume previous diet. High fiber diet recommended.                           - Continue present medications.                           - Await pathology results.                           - Repeat colonoscopy date to be determined after                            pending pathology results are reviewed for                            surveillance.                           -  Emerging evidence supports eating a diet of                            fruits, vegetables, grains, calcium, and yogurt                            while reducing red meat and alcohol may reduce the                            risk of  colon cancer.                           - Thank you for allowing me to be involved in your                            colon cancer prevention. Thornton Park MD, MD 04/08/2021 3:19:12 PM This report has been signed electronically.

## 2021-04-08 NOTE — Patient Instructions (Signed)
Handouts on polyps given to patient. Await pathology results. Resume previous diet and continue present medications. Recommended repeat colonoscopy for screening purposes will be determined based off of pathology results.   YOU HAD AN ENDOSCOPIC PROCEDURE TODAY AT [facility]:   Refer to the procedure report that was given to you for any specific questions about what was found during the examination.  If the procedure report does not answer your questions, please call your gastroenterologist to clarify.  If you requested that your care partner not be given the details of your procedure findings, then the procedure report has been included in a sealed envelope for you to review at your convenience later.  YOU SHOULD EXPECT: Some feelings of bloating in the abdomen. Passage of more gas than usual.  Walking can help get rid of the air that was put into your GI tract during the procedure and reduce the bloating. If you had a lower endoscopy (such as a colonoscopy or flexible sigmoidoscopy) you may notice spotting of blood in your stool or on the toilet paper. If you underwent a bowel prep for your procedure, you may not have a normal bowel movement for a few days.  Please Note:  You might notice some irritation and congestion in your nose or some drainage.  This is from the oxygen used during your procedure.  There is no need for concern and it should clear up in a day or so.  SYMPTOMS TO REPORT IMMEDIATELY:  Following lower endoscopy (colonoscopy or flexible sigmoidoscopy):  Excessive amounts of blood in the stool  Significant tenderness or worsening of abdominal pains  Swelling of the abdomen that is new, acute  Fever of 100F or higher  Following upper endoscopy (EGD)  Vomiting of blood or coffee ground material  New chest pain or pain under the shoulder blades  Painful or persistently difficult swallowing  New shortness of breath  Fever of 100F or higher  Black,  tarry-looking stools  For urgent or emergent issues, a gastroenterologist can be reached at any hour by calling [phone]. Do not use MyChart messaging for urgent concerns.    DIET:  We do recommend a small meal at first, but then you may proceed to your regular diet.  Drink plenty of fluids but you should avoid alcoholic beverages for 24 hours.  ACTIVITY:  You should plan to take it easy for the rest of today and you should NOT DRIVE or use heavy machinery until tomorrow (because of the sedation medicines used during the test).    FOLLOW UP: Our staff will call the number listed on your records 48-72 hours following your procedure to check on you and address any questions or concerns that you may have regarding the information given to you following your procedure. If we do not reach you, we will leave a message.  We will attempt to reach you two times.  During this call, we will ask if you have developed any symptoms of COVID 19. If you develop any symptoms (ie: fever, flu-like symptoms, shortness of breath, cough etc.) before then, please call [phone].  If you test positive for Covid 19 in the 2 weeks post procedure, please call and report this information to [place].    If any biopsies were taken you will be contacted by phone or by letter within the next 1-3 weeks.  Please call us at [phone] if you have not heard about the biopsies in 3 weeks.    SIGNATURES/CONFIDENTIALITY:  You and/or your care partner have signed paperwork which will be entered into your electronic medical record.  These signatures attest to the fact that that the information above on your After Visit Summary has been reviewed and is understood.  Full responsibility of the confidentiality of this discharge information lies with you and/or your care-partner. USTED TUVO UN PROCEDIMIENTO ENDOSCPICO HOY EN EL [facility]:   Lea el informe del procedimiento que se le entreg para cualquier pregunta  especfica sobre lo que se Primary school teacher.  Si el informe del examen no responde a sus preguntas, por favor llame a su gastroenterlogo para aclararlo.  Si usted solicit que no se le den [employer] de lo que se Estate manager/land agent en su procedimiento al [employer] va a cuidar, entonces el informe del procedimiento se ha incluido en un sobre sellado para que usted lo revise despus cuando le sea ms conveniente.   LO QUE PUEDE ESPERAR: Algunas sensaciones de hinchazn en el abdomen.  Puede tener ms gases de lo normal.  El caminar puede ayudarle a eliminar el aire que se le puso en el tracto gastrointestinal durante el procedimiento y reducir la hinchazn.  Si le hicieron una endoscopia inferior (como una colonoscopia o una sigmoidoscopia flexible), podra notar manchas de sangre en las heces fecales o en el papel higinico.  Si se someti a una preparacin intestinal para su procedimiento, es posible que no tenga una evacuacin intestinal normal durante [employer].   Tenga en cuenta:  Es posible que note un poco de irritacin y congestin en la nariz o algn drenaje.  Esto es debido al oxgeno [employer] durante su procedimiento.  No hay que preocuparse y esto debe desaparecer ms o Scientist, research (medical).   SNTOMAS PARA REPORTAR INMEDIATAMENTE:  Despus de una endoscopia inferior (colonoscopia o sigmoidoscopia flexible):  Cantidades excesivas de sangre en las heces fecales  Sensibilidad significativa o empeoramiento de los dolores abdominales   Hinchazn aguda del abdomen que antes no tena   Fiebre de 100F o ms   Despus de la endoscopia superior (EGD)  Vmitos de Biochemist, clinical o material como caf molido   Dolor en el pecho o dolor debajo de los omplatos que antes no tena   Dolor o dificultad persistente para tragar  Falta de aire que antes no tena   Fiebre de 100F o ms  Heces fecales negras y pegajosas   Para asuntos urgentes o de Freight forwarder, puede comunicarse con un gastroenterlogo a  cualquier hora llamando al [phone].  DIETA:  Recomendamos una comida pequea al principio, pero luego puede continuar con su dieta normal.  Tome muchos lquidos, Teacher, adult education las bebidas alcohlicas durante 24 horas.    ACTIVIDAD:  Debe planear tomarse las cosas con calma por el resto del da y no debe CONDUCIR ni usar maquinaria pesada Programmer, applications (debido a los medicamentos de sedacin utilizados durante el examen).     SEGUIMIENTO: Nuestro personal llamar al nmero que aparece en su historial al siguiente da hbil de su procedimiento para ver cmo se siente y para responder cualquier pregunta o inquietud que pueda tener con respecto a la informacin que se le dio despus del procedimiento. Si no podemos contactarle, le dejaremos un mensaje.  Sin embargo, si se siente bien y no tiene Paediatric nurse, no es necesario que nos devuelva la llamada.  Asumiremos que ha regresado a sus actividades diarias normales sin incidentes. Si se le tomaron algunas biopsias, le contactaremos por  telfono o por carta en las prximas 3 semanas.  Si no ha sabido [employer] biopsias en el transcurso de 3 semanas, por favor llmenos al [phone].   FIRMAS/CONFIDENCIALIDAD: Usted y/o el acompaante que le cuide han firmado documentos que se ingresarn en su historial mdico electrnico.  Estas firmas atestiguan el hecho de que la informacin anterior

## 2021-04-08 NOTE — Progress Notes (Signed)
   Referring Provider: Thornton Park, MD Primary Care Physician:  Patient, No Pcp Per (Inactive)   Indication for procedures:  reflux, abdominal pain   IMPRESSION:  Upper abdominal pain Reflux with associated globus Postprandial diarrhea - having 7 bowel movements daily Bright red blood noted on the toilet paper Elevated creatinine on last labs in April 2021 Microcytosis without anemia on labs in April 2021 Family history of stomach issues ? Gastric cancer (mother)  The differential for upper abdominal pain with associated reflux and post-prandial diarrhea includes esophagitis, gastritis, H pylori, gastroparesis, duodenitis, functional abdominal pain, SIBO, celiac, IBD, pancreatic and/or hepatobiliary disorders. The association with stress makes increases the likelihood for a component that is function.   PLAN: EGD with biopsies and colonoscopy with evaluation of the TI   HPI: Jerry Pratt is a 40 y.o. male who presents for endoscopic evaluation of abdominal pain. Please see my office note from 03/01/21 for complete details. No change in history or physical exam.   Past Medical History:  Diagnosis Date   COVID-19    GERD (gastroesophageal reflux disease)    Giardia     Past Surgical History:  Procedure Laterality Date   NO PAST SURGERIES       Current Outpatient Medications  Medication Sig Dispense Refill   famotidine (PEPCID) 20 MG tablet Take 1 tablet (20 mg total) by mouth at bedtime. 30 tablet 0   omeprazole (PRILOSEC) 20 MG capsule Take 1 capsule (20 mg total) by mouth daily. 90 capsule 0   potassium chloride (KLOR-CON) 10 MEQ tablet TAKE 1 TABLET(10 MEQ) BY MOUTH TWICE DAILY FOR 3 DAYS 6 tablet 0   No current facility-administered medications for this visit.    Allergies as of 04/08/2021   (No Known Allergies)      Physical Exam: General:   Alert,  well-nourished, pleasant and cooperative in NAD Head:  Normocephalic and atraumatic. Eyes:  Sclera  clear, no icterus.   Conjunctiva pink. Ears:  Normal auditory acuity. Nose:  No deformity, discharge,  or lesions. Mouth:  No deformity or lesions.   Neck:  Supple; no masses or thyromegaly. Lungs:  Clear throughout to auscultation.   No wheezes. Heart:  Regular rate and rhythm; no murmurs. Abdomen:  Soft, mild upper abdominal pain, nondistended, normal bowel sounds, no rebound or guarding. No hepatosplenomegaly.   Rectal:  Deferred  Msk:  Symmetrical. No boney deformities LAD: No inguinal or umbilical LAD Extremities:  No clubbing or edema. Neurologic:  Alert and  oriented x4;  grossly nonfocal Skin:  Intact without significant lesions or rashes. Psych:  Alert and cooperative. Normal mood and affect.    Marley Charlot L. Tarri Glenn, MD, MPH 04/08/2021, 1:34 PM

## 2021-04-08 NOTE — Op Note (Signed)
[place] Patient Name: Jerry Pratt Procedure Date: 04/08/2021 2:37 PM MRN: [id number] Endoscopist: Thornton Park MD, MD Age: 40 Referring MD:  Date of Birth: 1980/06/16 Gender: Male Account #: [id number] Procedure:                Upper GI endoscopy Indications:              Upper abdominal pain                           Reflux with associated globus                           Postprandial diarrhea - having 7 bowel movements                            daily                           Family history of stomach issues ? Gastric cancer                            (mother) Medicines:                Monitored Anesthesia Care Procedure:                Pre-Anesthesia Assessment:                           - Prior to the procedure, a History and Physical                            was performed, and patient medications and                            allergies were reviewed. The patient's tolerance of                            previous anesthesia was also reviewed. The risks                            and benefits of the procedure and the sedation                            options and risks were discussed with the patient.                            All questions were answered, and informed consent                            was obtained. Prior Anticoagulants: The patient has                            taken no previous anticoagulant or antiplatelet                            agents. ASA Grade Assessment: II - A  patient with                            mild systemic disease. After reviewing the risks                            and benefits, the patient was deemed in                            satisfactory condition to undergo the procedure.                           After obtaining informed consent, the endoscope was                            passed under direct vision. Throughout the                            procedure, the patient's blood pressure, pulse, and                             oxygen saturations were monitored continuously. The                            GIF [id number] #[id number] was introduced through the                            mouth, and advanced to the third part of duodenum.                            The upper GI endoscopy was accomplished without                            difficulty. The patient tolerated the procedure                            well. Scope In: Scope Out: Findings:                 The examined esophagus was normal. Biopsies were                            taken from the distal esophagus with a cold forceps                            for histology. Estimated blood loss was minimal.                           The entire examined stomach was normal. Biopsies                            were taken from the antrum, body, and fundus with a                            cold forceps for  histology. Estimated blood loss                            was minimal.                           Mildly erythematous mucosa without active bleeding                            and with no stigmata of bleeding was found in the                            duodenal bulb. Biopsies were taken with a cold                            forceps for histology. Estimated blood loss was                            minimal.                           The cardia and gastric fundus were normal on                            retroflexion.                           The exam was otherwise without abnormality. Complications:            No immediate complications. Estimated blood loss:                            Minimal. Estimated Blood Loss:     Estimated blood loss was minimal. Impression:               - Normal esophagus. Biopsied.                           - Normal stomach. Biopsied.                           - Erythematous duodenopathy. Biopsied.                           - The examination was otherwise normal. Recommendation:           - Patient has a contact number available  for                            emergencies. The signs and symptoms of potential                            delayed complications were discussed with the                            patient. Return to normal activities tomorrow.  Written discharge instructions were provided to the                            patient.                           - Resume previous diet.                           - Continue present medications.                           - Await pathology results. Thornton Park MD, MD 04/08/2021 2:57:42 PM This report has been signed electronically.

## 2021-04-12 ENCOUNTER — Telehealth: Payer: Self-pay

## 2021-04-12 ENCOUNTER — Telehealth: Payer: Self-pay | Admitting: *Deleted

## 2021-04-12 NOTE — Telephone Encounter (Signed)
  Follow up Call-  Call back number 04/08/2021  Post procedure Call Back phone  # [phone]- call with interpreter  Permission to leave phone message Yes     Patient questions:  Do you have a fever, pain , or abdominal swelling? No. Pain Score  0 *  Have you tolerated food without any problems? Yes.    Have you been able to return to your normal activities? Yes.    Do you have any questions about your discharge instructions: Diet   No. Medications  No. Follow up visit  No.  Do you have questions or concerns about your Care? No.  Actions: * If pain score is 4 or above: No action needed, pain <4.

## 2021-04-12 NOTE — Telephone Encounter (Signed)
  Follow up Call-  Call back number 04/08/2021  Post procedure Call Back phone  # [phone]- call with interpreter  Permission to leave phone message Yes   [facility]

## 2021-07-27 NOTE — Congregational Nurse Program (Signed)
?  Dept: [phone] ? ? ?Congregational Nurse Program Note ? ?Date of Encounter: 07/27/2021 ? ?Past Medical History: ?Past Medical History:  ?Diagnosis Date  ? COVID-19   ? GERD (gastroesophageal reflux disease)   ? Giardia   ? ? ?Encounter Details: ? ?Patient needs assistance finding a PCP. Referral form filled and sent to [facility].  ? ? ?

## 2021-08-05 ENCOUNTER — Encounter: Payer: Self-pay | Admitting: Nurse Practitioner

## 2021-08-05 ENCOUNTER — Ambulatory Visit (INDEPENDENT_AMBULATORY_CARE_PROVIDER_SITE_OTHER): Payer: 59 | Admitting: Nurse Practitioner

## 2021-08-05 ENCOUNTER — Ambulatory Visit (HOSPITAL_COMMUNITY)
Admission: RE | Admit: 2021-08-05 | Discharge: 2021-08-05 | Disposition: A | Discharge status: Home / Self Care | Payer: 59 | Source: Ambulatory Visit | Attending: Nurse Practitioner | Admitting: Nurse Practitioner

## 2021-08-05 VITALS — BP 131/95 | HR 89 | Temp 99.0°F | Ht 67.0 in | Wt 224.1 lb

## 2021-08-05 DIAGNOSIS — G8929 Other chronic pain: Secondary | ICD-10-CM | POA: Insufficient documentation

## 2021-08-05 DIAGNOSIS — M5442 Lumbago with sciatica, left side: Secondary | ICD-10-CM

## 2021-08-05 LAB — POCT URINALYSIS DIP (CLINITEK)
Bilirubin, UA: NEGATIVE
Glucose, UA: NEGATIVE mg/dL
Ketones, POC UA: NEGATIVE mg/dL
Leukocytes, UA: NEGATIVE
Nitrite, UA: NEGATIVE
POC PROTEIN,UA: NEGATIVE
Spec Grav, UA: >=1.03 — AB (ref 1.010–1.025)
Urobilinogen, UA: 0.2 E.U./dL
pH, UA: 6.5 (ref 5.0–8.0)

## 2021-08-05 MED ORDER — PREDNISONE 20 MG PO TABS: 20.0000 mg | ORAL_TABLET | Freq: Every day | ORAL | 0 refills | Status: AC

## 2021-08-05 MED ORDER — MELOXICAM 7.5 MG PO TABS: 7.5000 mg | ORAL_TABLET | Freq: Every day | ORAL | 0 refills | Status: DC

## 2021-08-05 MED ORDER — OMEPRAZOLE 20 MG PO CPDR: 20.0000 mg | DELAYED_RELEASE_CAPSULE | Freq: Every day | ORAL | 3 refills | Status: DC

## 2021-08-05 NOTE — Assessment & Plan Note (Signed)
-   DG Lumbar Spine Complete ?- meloxicam (MOBIC) 7.5 MG tablet; Take 1 tablet (7.5 mg total) by mouth daily.  Dispense: 30 tablet; Refill: 0 ?- predniSONE (DELTASONE) 20 MG tablet; Take 1 tablet (20 mg total) by mouth daily with breakfast for 5 days.  Dispense: 5 tablet; Refill: 0 ?- Ambulatory referral to Orthopedic Surgery ? ? ?Follow up: ? ?Follow up in 4 weeks for physical ?

## 2021-08-05 NOTE — Patient Instructions (Addendum)
1. Chronic midline low back pain with left-sided sciatica ? ?- DG Lumbar Spine Complete ?- meloxicam (MOBIC) 7.5 MG tablet; Take 1 tablet (7.5 mg total) by mouth daily.  Dispense: 30 tablet; Refill: 0 ?- predniSONE (DELTASONE) 20 MG tablet; Take 1 tablet (20 mg total) by mouth daily with breakfast for 5 days.  Dispense: 5 tablet; Refill: 0 ?- Ambulatory referral to Orthopedic Surgery ? ? ?Follow up: ? ?Follow up in 4 weeks for physical ? ? ? ?

## 2021-08-05 NOTE — Progress Notes (Signed)
$'@Patient'X$  ID: Jerry Pratt, male    DOB: 07-08-1980, 41 y.o.   MRN: [id number] ? ?Chief Complaint  ?Patient presents with  ? Establish Care  ?  Pt is here to establish care. Pt stated he has pain in his spine and back for the past 15 year's  ? ? ?Referring provider: ?No ref. provider found ? ? ?HPI ? ?Patient presents today with low back pain.  He states that this has been an issue for the past 15 years.  He states that the issue is progressively worsening.  He states that he does have midline low back pain which radiates down through his left buttock and left thigh.  He did have imaging 12 years ago in the previous country [place] and was told that he had bulging disc at that time.  He has not had any treatment or work-up for this issue since that time.  We discussed that we will get an x-ray today.  We will refer him to Ortho for this chronic pain. Denies f/c/s, n/v/d, hemoptysis, PND, chest pain or edema. ? ? ? ? ? ?No Known Allergies ? ?Immunization History  ?Administered Date(s) Administered  ? Tdap 08/21/2019  ? ? ?Past Medical History:  ?Diagnosis Date  ? COVID-19   ? GERD (gastroesophageal reflux disease)   ? Giardia   ? ? ?Tobacco History: ?Social History  ? ?Tobacco Use  ?Smoking Status Former  ? Types: Cigarettes  ? Quit date: 2019  ? Years since quitting: 4.2  ?Smokeless Tobacco Never  ?Tobacco Comments  ? States smoked cigarettes on weekends for 7-8 years and quit smoking in 2019  ? ?Counseling given: Not Answered ?Tobacco comments: States smoked cigarettes on weekends for 7-8 years and quit smoking in 2019 ? ? ?Outpatient Encounter Medications as of 08/05/2021  ?Medication Sig  ? meloxicam (MOBIC) 7.5 MG tablet Take 1 tablet (7.5 mg total) by mouth daily.  ? omeprazole (PRILOSEC) 20 MG capsule Take 1 capsule (20 mg total) by mouth daily.  ? predniSONE (DELTASONE) 20 MG tablet Take 1 tablet (20 mg total) by mouth daily with breakfast for 5 days.  ? [DISCONTINUED] ibuprofen (ADVIL) 800 MG tablet  Take 800 mg by mouth every 8 (eight) hours as needed.  ? famotidine (PEPCID) 20 MG tablet Take 1 tablet (20 mg total) by mouth at bedtime. (Patient not taking: Reported on 04/08/2021)  ? omeprazole (PRILOSEC) 20 MG capsule Take 1 capsule (20 mg total) by mouth daily. (Patient not taking: Reported on 04/08/2021)  ? ?No facility-administered encounter medications on file as of 08/05/2021.  ? ? ? ?Review of Systems ? ?Review of Systems  ?Constitutional: Negative.   ?HENT: Negative.    ?Cardiovascular: Negative.   ?Gastrointestinal: Negative.   ?Musculoskeletal:  Positive for back pain.  ?Allergic/Immunologic: Negative.   ?Neurological: Negative.   ?Psychiatric/Behavioral: Negative.     ? ? ? ?Physical Exam ? ?BP (!) 131/95 (BP Location: Left Arm, Cuff Size: Large)   Pulse 89   Temp 99 ?F (37.2 ?C)   Ht '5\' 7"'$  (1.702 m)   Wt 224 lb 2 oz (101.7 kg)   SpO2 100%   BMI 35.10 kg/m?  ? ?Wt Readings from Last 5 Encounters:  ?08/05/21 224 lb 2 oz (101.7 kg)  ?04/08/21 218 lb (98.9 kg)  ?03/01/21 218 lb 8 oz (99.1 kg)  ?02/18/21 220 lb (99.8 kg)  ? ? ? ?Physical Exam ?Vitals and nursing note reviewed.  ?Constitutional:   ?   General:  He is not in acute distress. ?   Appearance: He is well-developed.  ?Cardiovascular:  ?   Rate and Rhythm: Normal rate and regular rhythm.  ?Pulmonary:  ?   Effort: Pulmonary effort is normal.  ?   Breath sounds: Normal breath sounds.  ?Musculoskeletal:  ?   Lumbar back: Tenderness present. Decreased range of motion.  ?Skin: ?   General: Skin is warm and dry.  ?Neurological:  ?   Mental Status: He is alert and oriented to person, place, and time.  ? ? ? ?Lab Results: ? ?CBC ?   ?Component Value Date/Time  ? WBC 8.9 03/01/2021 1457  ? RBC 6.00 (H) 03/01/2021 1457  ? HGB 15.4 03/01/2021 1457  ? HCT 46.7 03/01/2021 1457  ? PLT 248.0 03/01/2021 1457  ? MCV 77.8 (L) 03/01/2021 1457  ? MCH 25.5 (L) 08/21/2019 0229  ? MCHC 33.0 03/01/2021 1457  ? RDW 13.6 03/01/2021 1457  ? LYMPHSABS 3.3 03/01/2021 1457   ? MONOABS 1.0 03/01/2021 1457  ? EOSABS 0.3 03/01/2021 1457  ? BASOSABS 0.1 03/01/2021 1457  ? ? ?BMET ?   ?Component Value Date/Time  ? NA 139 03/01/2021 1457  ? K 3.4 (L) 03/01/2021 1457  ? CL 103 03/01/2021 1457  ? CO2 28 03/01/2021 1457  ? GLUCOSE 85 03/01/2021 1457  ? BUN 13 03/01/2021 1457  ? CREATININE 1.29 03/01/2021 1457  ? CALCIUM 9.3 03/01/2021 1457  ? GFRNONAA >60 08/21/2019 0229  ? GFRAA >60 08/21/2019 0229  ? ? ?BNP ?No results found for: BNP ? ?ProBNP ?No results found for: PROBNP ? ?Imaging: ?No results found. ? ? ?Assessment & Plan:  ? ?Chronic midline low back pain with left-sided sciatica ?- DG Lumbar Spine Complete ?- meloxicam (MOBIC) 7.5 MG tablet; Take 1 tablet (7.5 mg total) by mouth daily.  Dispense: 30 tablet; Refill: 0 ?- predniSONE (DELTASONE) 20 MG tablet; Take 1 tablet (20 mg total) by mouth daily with breakfast for 5 days.  Dispense: 5 tablet; Refill: 0 ?- Ambulatory referral to Orthopedic Surgery ? ? ?Follow up: ? ?Follow up in 4 weeks for physical ? ? ? ? ?Fenton Foy, NP ?08/05/2021 ? ?

## 2021-08-18 ENCOUNTER — Encounter: Payer: Self-pay | Admitting: Orthopaedic Surgery

## 2021-08-18 ENCOUNTER — Ambulatory Visit (INDEPENDENT_AMBULATORY_CARE_PROVIDER_SITE_OTHER): Payer: 59 | Admitting: Orthopaedic Surgery

## 2021-08-18 ENCOUNTER — Other Ambulatory Visit: Payer: Self-pay

## 2021-08-18 DIAGNOSIS — G8929 Other chronic pain: Secondary | ICD-10-CM

## 2021-08-18 DIAGNOSIS — M5442 Lumbago with sciatica, left side: Secondary | ICD-10-CM | POA: Diagnosis not present

## 2021-08-18 NOTE — Progress Notes (Signed)
The patient is a very pleasant 41 year old gentleman who is sent from his nurse practitioner Lazaro Arms to evaluate and treat chronic left-sided low back pain with left-sided radicular symptoms and sciatica.  This has been going on for about 15 years now and slowly getting worse.  He is non-English-speaking has an interpreter with him.  He said [place] did have a MRI of his lumbar spine and he said surgery was recommended but he was younger and he felt that physical therapy and time would help him out.  He says now that he has moved.  Is been slowly worsening in terms of his left-sided low back pain and left-sided radicular symptoms.  This is slowly getting worse and has not changed.  He still denies any radicular symptoms on the right side.  He says his hamstrings are getting tighter and his back is getting tighter and his level of physical activity is gotten less as a relates to his low back pain.  He has tried back extension exercises and therapy.  He is worked on weight loss and activity modification.  He is tried anti-inflammatories and muscle relaxants.  He denies any change in bowel bladder function.  He is not a diabetic.  He does report numbness and tingling down his left leg. ? ?On exam he does have a positive straight leg raise to the left side.  He has significant pain with flexion and extension of the lumbar spine and he can only forward flex to about touching the upper ankle areas of both legs.  There is subjective numbness in the lower leg on the left side and not on the right side.  He has pain in the paraspinal muscles also to the left side.  There is pain on sciatic stretch.  His bilateral hip and knee exams are normal. ? ?X-rays on the canopy system of the lumbar spine show significant degenerative disc disease at L5-S1 and somewhat at L4-L5.  There is slight lessening of his lumbar lordosis as well. ? ?At this point a MRI of the lumbar spine is warranted given the  failure of conservative treatment for a long period of time.  An MRI could be useful in determining the degree of stenosis and degenerative disease of the disc that he has to help guide the next potential intervention for him given the failure of all other forms of conservative treatment.  He has not had any type of injection in the back and that is where the MRI will be useful in determining if that is the next step.  He agrees with this to the interpreter.  All question concerns were answered and addressed.  We will see him back after we have the MRI of his lumbar spine. ?

## 2021-08-24 ENCOUNTER — Telehealth: Payer: Self-pay | Admitting: Orthopaedic Surgery

## 2021-08-24 NOTE — Telephone Encounter (Signed)
LMOM for pt to return call to sch MRI [facility] review with Dr. Ninfa Linden after 09/03/21 ?

## 2021-08-26 ENCOUNTER — Encounter: Payer: Self-pay | Admitting: Gastroenterology

## 2021-08-26 DIAGNOSIS — K219 Gastro-esophageal reflux disease without esophagitis: Secondary | ICD-10-CM

## 2021-08-26 DIAGNOSIS — A048 Other specified bacterial intestinal infections: Secondary | ICD-10-CM

## 2021-08-26 MED ORDER — METRONIDAZOLE 500 MG PO TABS
500.0000 mg | ORAL_TABLET | Freq: Four times a day (QID) | ORAL | 0 refills | Status: AC
Start: 2021-08-26 — End: 2021-09-09

## 2021-08-26 MED ORDER — BISMUTH SUBSALICYLATE 262 MG PO CHEW: 262.0000 mg | CHEWABLE_TABLET | Freq: Four times a day (QID) | ORAL | 0 refills | Status: AC

## 2021-08-26 MED ORDER — TETRACYCLINE HCL 500 MG PO CAPS: 500.0000 mg | ORAL_CAPSULE | Freq: Four times a day (QID) | ORAL | 0 refills | Status: AC

## 2021-08-26 MED ORDER — PANTOPRAZOLE SODIUM 40 MG PO TBEC: 40.0000 mg | DELAYED_RELEASE_TABLET | Freq: Two times a day (BID) | ORAL | 0 refills | Status: DC

## 2021-08-31 NOTE — Telephone Encounter (Signed)
Charted in error, please disregard.

## 2021-09-03 ENCOUNTER — Other Ambulatory Visit: Payer: Self-pay | Admitting: Nurse Practitioner

## 2021-09-03 ENCOUNTER — Ambulatory Visit (HOSPITAL_COMMUNITY)
Admission: RE | Admit: 2021-09-03 | Discharge: 2021-09-03 | Disposition: A | Discharge status: Home / Self Care | Payer: 59 | Source: Ambulatory Visit | Attending: Orthopaedic Surgery | Admitting: Orthopaedic Surgery

## 2021-09-03 ENCOUNTER — Ambulatory Visit: Payer: Self-pay | Admitting: Nurse Practitioner

## 2021-09-03 DIAGNOSIS — M5442 Lumbago with sciatica, left side: Secondary | ICD-10-CM | POA: Insufficient documentation

## 2021-09-03 DIAGNOSIS — G8929 Other chronic pain: Secondary | ICD-10-CM | POA: Diagnosis present

## 2021-09-08 ENCOUNTER — Ambulatory Visit (INDEPENDENT_AMBULATORY_CARE_PROVIDER_SITE_OTHER): Payer: 59 | Admitting: Orthopaedic Surgery

## 2021-09-08 ENCOUNTER — Encounter: Payer: Self-pay | Admitting: Orthopaedic Surgery

## 2021-09-08 ENCOUNTER — Other Ambulatory Visit: Payer: Self-pay

## 2021-09-08 DIAGNOSIS — M5126 Other intervertebral disc displacement, lumbar region: Secondary | ICD-10-CM

## 2021-09-08 DIAGNOSIS — M5442 Lumbago with sciatica, left side: Secondary | ICD-10-CM

## 2021-09-08 DIAGNOSIS — G8929 Other chronic pain: Secondary | ICD-10-CM | POA: Diagnosis not present

## 2021-09-08 MED ORDER — METHOCARBAMOL 750 MG PO TABS: 750.0000 mg | ORAL_TABLET | Freq: Three times a day (TID) | ORAL | 1 refills | Status: DC | PRN

## 2021-09-08 NOTE — Progress Notes (Signed)
The patient comes in today to go over an MRI of his lumbar spine.  He does have an interpreter with him.  He has been dealing with a 15-year history of left-sided low back pain that does radiate into the sciatic area and down his left leg into the bottom of his left foot.  He is very active and works hard.  He is 41 years old. ? ?He still has the same left-sided sciatica and radicular symptoms going down his left leg.  There is no gross weakness. ? ?The MRI does show a left-sided paracentral to subarticular disc protrusion which causes narrowing of the left lateral recess and is probably irritating the left S1 nerve root.  This seems to correlate with his clinical exam as well. ? ?Through the interpreter he is interested in trying an epidural steroid injection.  We can set this up through Dr. Ernestina Patches section.  I would then recommend to follow-up with Dr. Lorin Mercy post the injection to talk about the possibilities of surgery in the future if the injection is not successful. ?

## 2021-09-14 ENCOUNTER — Other Ambulatory Visit: Payer: Self-pay

## 2021-09-14 ENCOUNTER — Emergency Department (HOSPITAL_COMMUNITY): Payer: 59

## 2021-09-14 ENCOUNTER — Encounter (HOSPITAL_COMMUNITY): Payer: Self-pay | Admitting: Emergency Medicine

## 2021-09-14 ENCOUNTER — Ambulatory Visit: Payer: 59 | Admitting: Orthopaedic Surgery

## 2021-09-14 ENCOUNTER — Emergency Department (HOSPITAL_COMMUNITY)
Admission: EM | Admit: 2021-09-14 | Discharge: 2021-09-14 | Disposition: A | Discharge status: Home / Self Care | Payer: 59 | Attending: Emergency Medicine | Admitting: Emergency Medicine

## 2021-09-14 DIAGNOSIS — M546 Pain in thoracic spine: Secondary | ICD-10-CM | POA: Diagnosis not present

## 2021-09-14 DIAGNOSIS — J208 Acute bronchitis due to other specified organisms: Secondary | ICD-10-CM | POA: Insufficient documentation

## 2021-09-14 DIAGNOSIS — R0789 Other chest pain: Secondary | ICD-10-CM | POA: Diagnosis not present

## 2021-09-14 DIAGNOSIS — Z20822 Contact with and (suspected) exposure to covid-19: Secondary | ICD-10-CM | POA: Insufficient documentation

## 2021-09-14 DIAGNOSIS — J029 Acute pharyngitis, unspecified: Secondary | ICD-10-CM

## 2021-09-14 DIAGNOSIS — R7989 Other specified abnormal findings of blood chemistry: Secondary | ICD-10-CM | POA: Diagnosis not present

## 2021-09-14 DIAGNOSIS — J40 Bronchitis, not specified as acute or chronic: Secondary | ICD-10-CM

## 2021-09-14 LAB — BASIC METABOLIC PANEL
Anion gap: 6 (ref 5–15)
BUN: 12 mg/dL (ref 6–20)
CO2: 25 mmol/L (ref 22–32)
Calcium: 9 mg/dL (ref 8.9–10.3)
Chloride: 108 mmol/L (ref 98–111)
Creatinine, Ser: 1.56 mg/dL — ABNORMAL HIGH (ref 0.61–1.24)
GFR, Estimated: 56 mL/min — ABNORMAL LOW (ref >60)
Glucose, Bld: 98 mg/dL (ref 70–99)
Potassium: 3.9 mmol/L (ref 3.5–5.1)
Sodium: 139 mmol/L (ref 135–145)

## 2021-09-14 LAB — RESP PANEL BY RT-PCR (FLU A&B, COVID) ARPGX2
Influenza A by PCR: NEGATIVE
Influenza B by PCR: NEGATIVE
SARS Coronavirus 2 by RT PCR: NEGATIVE

## 2021-09-14 LAB — CBC
HCT: 45.1 % (ref 39.0–52.0)
Hemoglobin: 16 g/dL (ref 13.0–17.0)
MCH: 26.8 pg (ref 26.0–34.0)
MCHC: 35.5 g/dL (ref 30.0–36.0)
MCV: 75.7 fL — ABNORMAL LOW (ref 80.0–100.0)
Platelets: 241 10*3/uL (ref 150–400)
RBC: 5.96 MIL/uL — ABNORMAL HIGH (ref 4.22–5.81)
RDW: 13.1 % (ref 11.5–15.5)
WBC: 9.2 10*3/uL (ref 4.0–10.5)
nRBC: 0 % (ref 0.0–0.2)

## 2021-09-14 LAB — TROPONIN I (HIGH SENSITIVITY)
Troponin I (High Sensitivity): 5 ng/L (ref <18)
Troponin I (High Sensitivity): 6 ng/L (ref <18)

## 2021-09-14 MED ORDER — SODIUM CHLORIDE 0.9 % IV BOLUS
1000.0000 mL | Freq: Once | INTRAVENOUS | Status: AC
  Administered 2021-09-14: 1000 mL via INTRAVENOUS

## 2021-09-14 MED ORDER — PROMETHAZINE-DM 6.25-15 MG/5ML PO SYRP: 5.0000 mL | ORAL_SOLUTION | Freq: Four times a day (QID) | ORAL | 0 refills | Status: AC | PRN

## 2021-09-14 MED ORDER — ALBUTEROL SULFATE HFA 108 (90 BASE) MCG/ACT IN AERS: 1.0000 | INHALATION_SPRAY | Freq: Four times a day (QID) | RESPIRATORY_TRACT | 0 refills | Status: DC | PRN

## 2021-09-14 MED ORDER — DEXAMETHASONE 2 MG PO TABS
10.0000 mg | ORAL_TABLET | Freq: Once | ORAL | Status: AC
  Administered 2021-09-14: 10 mg via ORAL
  Filled 2021-09-14: qty 5

## 2021-09-14 NOTE — ED Triage Notes (Addendum)
Pt reported to ED with c/o pain to generalized chest and upper back. Reports  shortness of breath and cough that "makes him feel like he is about to explode". Reports cough as dry and generally non-productive occasionally produces phlegm. Symptoms have been ongoing x3 days. Denies nausea or vomiting or hx of CHF.  ?

## 2021-09-14 NOTE — ED Provider Notes (Signed)
?[place] ?Provider Note ? ? ?CSN: [id number] ?Arrival date & time: 09/14/21  0605 ? ?  ? ?History ?PMH: GERD ?Chief Complaint  ?Patient presents with  ? Chest Pain  ? ?An interpreter was used during this encounter.  ?Jerry Pratt is a 41 y.o. male. ?Patient presents with a chief complaint of chest pain.  Patient states that he first noticed having a sore throat and cough about 3 days ago.  He states that his cough is productive and he coughs up white sputum.  He denies any hemoptysis.  He first noticed the chest pain and back pain yesterday.  The chest pain is across the top of his chest and the back pain is across the top of his back.  He states that the pain is constant, however is significantly worse when he coughs or takes a deep breath.  Pain is not exertional.  He states that he has some mild associated shortness of breath.  He denies any fevers or chills, abdominal pain, nausea, vomiting, or diarrhea.  He denies any new leg swelling or leg pain.  He has no other sick contacts.  He has no history of blood clots.  He denies any recent surgery.  Denies any hormonal use.  No history of cancer.  No recent immobilization. ? ? ?Chest Pain ?Associated symptoms: back pain, cough and shortness of breath   ?Associated symptoms: no abdominal pain, no dizziness, no dysphagia, no fever, no nausea, no vomiting and no weakness   ? ?HPI: A 41 year old patient presents for evaluation of chest pain. Initial onset of pain was less than one hour ago. The patient's chest pain is not worse with exertion. The patient's chest pain is middle- or left-sided, is not well-localized, is not described as heaviness/pressure/tightness, is not sharp and does not radiate to the arms/jaw/neck. The patient does not complain of nausea and denies diaphoresis. The patient has no history of stroke, has no history of peripheral artery disease, has not smoked in the past 90 days, denies any history of treated  diabetes, has no relevant family history of coronary artery disease (first degree relative at less than age 43), is not hypertensive, has no history of hypercholesterolemia and does not have an elevated BMI (>=30).  ? ?Home Medications ?Prior to Admission medications   ?Medication Sig Start Date End Date Taking? Authorizing Provider  ?albuterol (VENTOLIN HFA) 108 (90 Base) MCG/ACT inhaler Inhale 1-2 puffs into the lungs every 6 (six) hours as needed for up to 7 days for wheezing or shortness of breath. 09/14/21 09/21/21 Yes Sabre Romberger, Adora Fridge, PA-C  ?promethazine-dextromethorphan (PROMETHAZINE-DM) 6.25-15 MG/5ML syrup Take 5 mLs by mouth 4 (four) times daily as needed for up to 4 days for cough. 09/14/21 09/18/21 Yes Samanthan Dugo, Adora Fridge, PA-C  ?famotidine (PEPCID) 20 MG tablet Take 1 tablet (20 mg total) by mouth at bedtime. ?Patient not taking: Reported on 04/08/2021 02/18/21   Teodora Medici, FNP  ?meloxicam (MOBIC) 7.5 MG tablet Take 1 tablet (7.5 mg total) by mouth daily. 08/05/21   Fenton Foy, NP  ?methocarbamol (ROBAXIN) 750 MG tablet Take 1 tablet (750 mg total) by mouth every 8 (eight) hours as needed for muscle spasms. 09/08/21   Mcarthur Rossetti, MD  ?pantoprazole (PROTONIX) 40 MG tablet Take 1 tablet (40 mg total) by mouth 2 (two) times daily for 14 days. 08/26/21 09/09/21  Thornton Park, MD  ?   ? ?Allergies    ?Patient has no known allergies.   ? ?  Review of Systems   ?Review of Systems  ?Constitutional:  Negative for chills and fever.  ?HENT:  Positive for sore throat. Negative for trouble swallowing.   ?Eyes:  Negative for visual disturbance.  ?Respiratory:  Positive for cough and shortness of breath. Negative for wheezing.   ?Cardiovascular:  Positive for chest pain. Negative for leg swelling.  ?Gastrointestinal:  Negative for abdominal pain, diarrhea, nausea and vomiting.  ?Musculoskeletal:  Positive for back pain. Negative for neck stiffness.  ?Skin:  Negative for rash.  ?Neurological:  Negative  for dizziness and weakness.  ?All other systems reviewed and are negative. ? ?Physical Exam ?Updated Vital Signs ?BP (!) 132/102   Pulse 72   Temp 98.5 ?F (36.9 ?C) (Oral)   Resp 15   SpO2 96%  ?Physical Exam ?Vitals and nursing note reviewed.  ?Constitutional:   ?   General: He is not in acute distress. ?   Appearance: Normal appearance. He is well-developed. He is not ill-appearing, toxic-appearing or diaphoretic.  ?HENT:  ?   Head: Normocephalic and atraumatic.  ?   Nose: No nasal deformity.  ?   Mouth/Throat:  ?   Lips: Pink. No lesions.  ?   Mouth: Mucous membranes are moist. No injury, lacerations, oral lesions or angioedema.  ?   Pharynx: Oropharynx is clear. Uvula midline. No pharyngeal swelling, oropharyngeal exudate, posterior oropharyngeal erythema or uvula swelling.  ?   Tonsils: No tonsillar exudate or tonsillar abscesses. 1+ on the right. 1+ on the left.  ?   Comments: There are no tonsillar exudates uvula is midline.  No evidence of peritonsillar abscess. ?Eyes:  ?   General: Gaze aligned appropriately. No scleral icterus.    ?   Right eye: No discharge.     ?   Left eye: No discharge.  ?   Conjunctiva/sclera: Conjunctivae normal.  ?   Right eye: Right conjunctiva is not injected. No exudate or hemorrhage. ?   Left eye: Left conjunctiva is not injected. No exudate or hemorrhage. ?Neck:  ?   Vascular: No JVD.  ?   Trachea: No tracheal deviation.  ?Cardiovascular:  ?   Rate and Rhythm: Normal rate and regular rhythm.  ?   Pulses: Normal pulses.     ?     Radial pulses are 2+ on the right side and 2+ on the left side.  ?     Dorsalis pedis pulses are 2+ on the right side and 2+ on the left side.  ?   Heart sounds: Normal heart sounds, S1 normal and S2 normal. Heart sounds not distant. No murmur heard. ?  No friction rub. No gallop. No S3 or S4 sounds.  ?Pulmonary:  ?   Effort: Pulmonary effort is normal. No tachypnea, accessory muscle usage or respiratory distress.  ?   Breath sounds: Normal breath  sounds. No stridor. No decreased breath sounds, wheezing, rhonchi or rales.  ?Chest:  ?   Chest wall: No tenderness.  ?Abdominal:  ?   General: Abdomen is flat. There is no distension.  ?   Palpations: Abdomen is soft. There is no mass or pulsatile mass.  ?   Tenderness: There is no abdominal tenderness. There is no guarding or rebound.  ?Musculoskeletal:  ?   Cervical back: Neck supple.  ?   Right lower leg: No tenderness. No edema.  ?   Left lower leg: No tenderness. No edema.  ?   Comments: No posterior calf or popliteal tenderness bilaterally. No  unilateral or bilateral leg swelling. No lower ext erythema.   ?Lymphadenopathy:  ?   Cervical: No cervical adenopathy.  ?   Right cervical: No superficial, deep or posterior cervical adenopathy. ?   Left cervical: No superficial, deep or posterior cervical adenopathy.  ?Skin: ?   General: Skin is warm and dry.  ?   Coloration: Skin is not jaundiced or pale.  ?   Findings: No bruising, erythema, lesion or rash.  ?Neurological:  ?   General: No focal deficit present.  ?   Mental Status: He is alert and oriented to person, place, and time.  ?   GCS: GCS eye subscore is 4. GCS verbal subscore is 5. GCS motor subscore is 6.  ?Psychiatric:     ?   Mood and Affect: Mood normal.     ?   Behavior: Behavior normal. Behavior is cooperative.  ? ? ?ED Results / Procedures / Treatments   ?Labs ?(all labs ordered are listed, but only abnormal results are displayed) ?Labs Reviewed  ?BASIC METABOLIC PANEL - Abnormal; Notable for the following components:  ?    Result Value  ? Creatinine, Ser 1.56 (*)   ? GFR, Estimated 56 (*)   ? All other components within normal limits  ?CBC - Abnormal; Notable for the following components:  ? RBC 5.96 (*)   ? MCV 75.7 (*)   ? All other components within normal limits  ?RESP PANEL BY RT-PCR (FLU A&B, COVID) [id number]  ?TROPONIN I (HIGH SENSITIVITY)  ?TROPONIN I (HIGH SENSITIVITY)  ? ? ?EKG ?EKG Interpretation ? ?Date/Time:  Tuesday Sep 14 2021 06:20:56  EDT ?Ventricular Rate:  88 ?PR Interval:  146 ?QRS Duration: 96 ?QT Interval:  351 ?QTC Calculation: 425 ?R Axis:   50 ?Text Interpretation: Sinus rhythm Confirmed by Godfrey Pick [phone]) on 09/14/2021 7:02:50 AM

## 2021-09-14 NOTE — Discharge Instructions (Signed)
Your workup today was reassuring. I think it is likely that you have a viral bronchitis. I have given you a steroid here in the ED that will help with decreasing inflammation in your airway. I have also prescribed you a cough suppressant that you can use. This should help alleviate the chest discomfort that you have. If you are having any shortness of breath, I have prescribed you an inhaler to use as needed. You do not need to use this if you are not having breathing difficulty.  ? ?If you start developing any worsening symptoms such as inability to tolerate oral food or fluids, fevers, worsening breathing issues, or worsening chest pain please return to the ED to be reevaluated.  ?

## 2021-10-28 ENCOUNTER — Ambulatory Visit (INDEPENDENT_AMBULATORY_CARE_PROVIDER_SITE_OTHER): Payer: 59 | Admitting: Physical Medicine and Rehabilitation

## 2021-10-28 ENCOUNTER — Ambulatory Visit: Payer: Self-pay

## 2021-10-28 ENCOUNTER — Encounter: Payer: Self-pay | Admitting: Physical Medicine and Rehabilitation

## 2021-10-28 VITALS — BP 127/88 | HR 84

## 2021-10-28 DIAGNOSIS — M5416 Radiculopathy, lumbar region: Secondary | ICD-10-CM | POA: Diagnosis not present

## 2021-10-28 MED ORDER — METHYLPREDNISOLONE ACETATE 80 MG/ML IJ SUSP
80.0000 mg | Freq: Once | INTRAMUSCULAR | Status: AC
  Administered 2021-10-28: 80 mg

## 2021-10-28 NOTE — Progress Notes (Signed)
Pt state lower back pain that travels down his left leg. Pt state bending and standing makes the pain worse. Pt state he takes over the counter pain meds to help his pain.  Numeric Pain Rating Scale and Functional Assessment Average Pain 9 9   In the last MONTH (on 0-10 scale) has pain interfered with the following?  1. General activity like being  able to carry out your everyday physical activities such as walking, climbing stairs, carrying groceries, or moving a chair?  Rating(10)   +Driver, -BT, -Dye Allergies.

## 2021-10-28 NOTE — Patient Instructions (Signed)

## 2021-11-04 ENCOUNTER — Other Ambulatory Visit: Payer: 59

## 2021-11-08 ENCOUNTER — Other Ambulatory Visit: Payer: 59

## 2021-11-08 DIAGNOSIS — A048 Other specified bacterial intestinal infections: Secondary | ICD-10-CM

## 2021-11-10 LAB — HELICOBACTER PYLORI  SPECIAL ANTIGEN
MICRO NUMBER:: 13601344
RESULT:: DETECTED — AB
SPECIMEN QUALITY: ADEQUATE

## 2021-11-18 ENCOUNTER — Ambulatory Visit: Payer: 59 | Admitting: Physical Medicine and Rehabilitation

## 2021-11-21 NOTE — Progress Notes (Signed)
Jerry Pratt - 41 y.o. male MRN [id number]  Date of birth: April 14, 1981  Office Visit Note: Visit Date: 10/28/2021 PCP: Patient, No Pcp Per Referred by: Mcarthur Rossetti*  Subjective: Chief Complaint  Patient presents with   Lower Back - Pain   Left Leg - Pain   HPI:  Jerry Pratt is a 41 y.o. male who comes in today at the request of Dr. Jean Rosenthal for planned Left L5-S1 Lumbar Interlaminar epidural steroid injection with fluoroscopic guidance.  The patient has failed conservative care including home exercise, medications, time and activity modification.  This injection will be diagnostic and hopefully therapeutic.  Please see requesting physician notes for further details and justification.   ROS Otherwise per HPI.  Assessment & Plan: Visit Diagnoses:    ICD-10-CM   1. Lumbar radiculopathy  M54.16 XR C-ARM NO REPORT    Epidural Steroid injection    methylPREDNISolone acetate (DEPO-MEDROL) injection 80 mg      Plan: No additional findings.   Meds & Orders:  Meds ordered this encounter  Medications   methylPREDNISolone acetate (DEPO-MEDROL) injection 80 mg    Orders Placed This Encounter  Procedures   XR C-ARM NO REPORT   Epidural Steroid injection    Follow-up: Return for visit to requesting provider as needed.   Procedures: No procedures performed  Lumbar Epidural Steroid Injection - Interlaminar Approach with Fluoroscopic Guidance  Patient: Jerry Pratt      Date of Birth: 03/07/1981 MRN: [id number] PCP: Patient, No Pcp Per      Visit Date: 10/28/2021   Universal Protocol:     Consent Given By: the patient  Position: PRONE  Additional Comments: Vital signs were monitored before and after the procedure. Patient was prepped and draped in the usual sterile fashion. The correct patient, procedure, and site was verified.   Injection Procedure Details:   Procedure diagnoses: Lumbar radiculopathy [M54.16]   Meds Administered:  Meds  ordered this encounter  Medications   methylPREDNISolone acetate (DEPO-MEDROL) injection 80 mg     Laterality: Left  Location/Site:  L5-S1  Needle: 3.5 in., 20 ga. Tuohy  Needle Placement: Paramedian epidural  Findings:   -Comments: Excellent flow of contrast into the epidural space.  Procedure Details: Using a paramedian approach from the side mentioned above, the region overlying the inferior lamina was localized under fluoroscopic visualization and the soft tissues overlying this structure were infiltrated with 4 ml. of 1% Lidocaine without Epinephrine. The Tuohy needle was inserted into the epidural space using a paramedian approach.   The epidural space was localized using loss of resistance along with counter oblique bi-planar fluoroscopic views.  After negative aspirate for air, blood, and CSF, a 2 ml. volume of Isovue-250 was injected into the epidural space and the flow of contrast was observed. Radiographs were obtained for documentation purposes.    The injectate was administered into the level noted above.   Additional Comments:  The patient tolerated the procedure well Dressing: 2 x 2 sterile gauze and Band-Aid    Post-procedure details: Patient was observed during the procedure. Post-procedure instructions were reviewed.  Patient left the clinic in stable condition.   Clinical History: MRI LUMBAR SPINE WITHOUT CONTRAST   TECHNIQUE: Multiplanar, multisequence MR imaging of the lumbar spine was performed. No intravenous contrast was administered.   COMPARISON:  No prior MRI, correlation is made with lumbar spine radiographs 08/05/2021   FINDINGS: Segmentation:  Standard.   Alignment: Straightening of the normal lumbar lordosis. No significant listhesis.  Vertebrae:  No acute fracture or suspicious osseous lesion.   Conus medullaris and cauda equina: Conus extends to the L1 level. Conus and cauda equina appear normal.   Paraspinal and other soft  tissues: Negative.   Disc levels:   T12-L1: No significant disc bulge. No spinal canal stenosis or neural foraminal narrowing.   L1-L2: Minimal disc bulge with small central protrusion. No spinal canal stenosis or neural foraminal narrowing.   L2-L3: Mild disc bulge with central annular fissure. No spinal canal stenosis or neural foraminal narrowing.   L3-L4: Mild disc bulge with central annular fissure. No spinal canal stenosis or neural foraminal narrowing.   L4-L5: Mild disc bulge. No spinal canal stenosis or neural foraminal narrowing.   L5-S1: Disc bulge with left paracentral/subarticular disc protrusion, which narrows the left lateral recess and likely contacts the descending left S1 nerve roots. No spinal canal stenosis or neural foraminal narrowing.   IMPRESSION: 1. L5-S1 left paracentral/subarticular disc protrusion, which narrows the left lateral recess and likely contacts the descending left S1 nerve roots.   2. Otherwise mild degenerative changes, without spinal canal stenosis or neural foraminal narrowing in the lumbar.     Electronically Signed   By: Merilyn Baba M.D.   On: 09/05/2021 03:23     Objective:  VS:  HT:    WT:   BMI:     BP:127/88  HR:84bpm  TEMP: ( )  RESP:  Physical Exam Vitals and nursing note reviewed.  Constitutional:      General: He is not in acute distress.    Appearance: Normal appearance. He is not ill-appearing.  HENT:     Head: Normocephalic and atraumatic.     Right Ear: External ear normal.     Left Ear: External ear normal.     Nose: No congestion.  Eyes:     Extraocular Movements: Extraocular movements intact.  Cardiovascular:     Rate and Rhythm: Normal rate.     Pulses: Normal pulses.  Pulmonary:     Effort: Pulmonary effort is normal. No respiratory distress.  Abdominal:     General: There is no distension.     Palpations: Abdomen is soft.  Musculoskeletal:        General: No tenderness or signs of  injury.     Cervical back: Neck supple.     Right lower leg: No edema.     Left lower leg: No edema.     Comments: Patient has good distal strength without clonus.  Skin:    Findings: No erythema or rash.  Neurological:     General: No focal deficit present.     Mental Status: He is alert and oriented to person, place, and time.     Sensory: No sensory deficit.     Motor: No weakness or abnormal muscle tone.     Coordination: Coordination normal.  Psychiatric:        Mood and Affect: Mood normal.        Behavior: Behavior normal.      Imaging: No results found.

## 2021-11-21 NOTE — Procedures (Signed)
Lumbar Epidural Steroid Injection - Interlaminar Approach with Fluoroscopic Guidance  Patient: Jerry Pratt      Date of Birth: 08-12-1980 MRN: [id number] PCP: Patient, No Pcp Per      Visit Date: 10/28/2021   Universal Protocol:     Consent Given By: the patient  Position: PRONE  Additional Comments: Vital signs were monitored before and after the procedure. Patient was prepped and draped in the usual sterile fashion. The correct patient, procedure, and site was verified.   Injection Procedure Details:   Procedure diagnoses: Lumbar radiculopathy [M54.16]   Meds Administered:  Meds ordered this encounter  Medications   methylPREDNISolone acetate (DEPO-MEDROL) injection 80 mg     Laterality: Left  Location/Site:  L5-S1  Needle: 3.5 in., 20 ga. Tuohy  Needle Placement: Paramedian epidural  Findings:   -Comments: Excellent flow of contrast into the epidural space.  Procedure Details: Using a paramedian approach from the side mentioned above, the region overlying the inferior lamina was localized under fluoroscopic visualization and the soft tissues overlying this structure were infiltrated with 4 ml. of 1% Lidocaine without Epinephrine. The Tuohy needle was inserted into the epidural space using a paramedian approach.   The epidural space was localized using loss of resistance along with counter oblique bi-planar fluoroscopic views.  After negative aspirate for air, blood, and CSF, a 2 ml. volume of Isovue-250 was injected into the epidural space and the flow of contrast was observed. Radiographs were obtained for documentation purposes.    The injectate was administered into the level noted above.   Additional Comments:  The patient tolerated the procedure well Dressing: 2 x 2 sterile gauze and Band-Aid    Post-procedure details: Patient was observed during the procedure. Post-procedure instructions were reviewed.  Patient left the clinic in stable condition.

## 2021-12-08 ENCOUNTER — Other Ambulatory Visit: Payer: Commercial Managed Care - HMO

## 2021-12-08 ENCOUNTER — Encounter: Payer: Self-pay | Admitting: Gastroenterology

## 2021-12-08 ENCOUNTER — Ambulatory Visit (INDEPENDENT_AMBULATORY_CARE_PROVIDER_SITE_OTHER): Payer: Commercial Managed Care - HMO | Admitting: Gastroenterology

## 2021-12-08 DIAGNOSIS — K219 Gastro-esophageal reflux disease without esophagitis: Secondary | ICD-10-CM

## 2021-12-08 DIAGNOSIS — A048 Other specified bacterial intestinal infections: Secondary | ICD-10-CM | POA: Diagnosis not present

## 2021-12-08 MED ORDER — LEVOFLOXACIN 500 MG PO TABS
500.0000 mg | ORAL_TABLET | Freq: Every day | ORAL | 0 refills | Status: AC
Start: 2021-12-08 — End: 2021-12-22

## 2021-12-08 MED ORDER — PANTOPRAZOLE SODIUM 40 MG PO TBEC: 40.0000 mg | DELAYED_RELEASE_TABLET | Freq: Two times a day (BID) | ORAL | 0 refills | Status: DC

## 2021-12-08 MED ORDER — AMOXICILLIN 500 MG PO TABS
1000.0000 mg | ORAL_TABLET | Freq: Two times a day (BID) | ORAL | 0 refills | Status: AC
Start: 2021-12-08 — End: 2021-12-22

## 2021-12-08 NOTE — Patient Instructions (Addendum)
We have sent the following medications to your pharmacy for you to pick up at your convenience: Levofloxacin 500 mg daily x 14 days amoxicillin 1000 mg twice daily taken 30 minutes after meals pantoprazole 40 mg twice daily x 14 days  Your provider has requested that you go to the basement level for lab work 2-3 weeks AFTER you have completed your medication. Press "B" on the elevator. The lab is located at the first door on the left as you exit the elevator.  Please follow up with Dr Tarri Glenn on 01/31/22 at 2:30 pm.  _______________________________________________________  If you are age 48 or older, your body mass index should be between 23-30. Your Body mass index is 35.43 kg/m. If this is out of the aforementioned range listed, please consider follow up with your Primary Care Provider.  If you are age 72 or younger, your body mass index should be between 19-25. Your Body mass index is 35.43 kg/m. If this is out of the aformentioned range listed, please consider follow up with your Primary Care Provider.   ________________________________________________________  The [facility] providers would like to encourage you to use [facility] to communicate with providers for non-urgent requests or questions.  Due to long hold times on the telephone, sending your provider a message by [facility] may be a faster and more efficient way to get a response.  Please allow 48 business hours for a response.  Please remember that this is for non-urgent requests.  _______________________________________________________  Due to recent changes in healthcare laws, you may see the results of your imaging and laboratory studies on MyChart before your provider has had a chance to review them.  We understand that in some cases there may be results that are confusing or concerning to you. Not all laboratory results come back in the same time frame and the provider may be waiting for multiple results in order to interpret  others.  Please give [place] 48 hours in order for your provider to thoroughly review all the results before contacting the office for clarification of your results.   ________________________________________________________   Jerry Pratt siguientes medicamentos a su farmacia para que los recoja a su conveniencia: Levofloxacina 500 mg diarios x 14 das amoxicilina 1000 mg dos veces al da tomada 30 minutos despus de las comidas pantoprazol 40 mg [place] x 14 das   Su proveedor le ha pedido que vaya al nivel del stano para el anlisis de laboratorio 2-3 semanas DESPUS de que haya terminado su medicamento. Presione "B" en el ascensor. El laboratorio est ubicado en la primera puerta a la izquierda al salir del Materials engineer.   Haga un seguimiento con el Dr. Tarri Glenn el 25/9/23 a las 2:30 p. m.  _______________________________________________________  Si tiene 73 aos o ms, su ndice de masa corporal debe estar entre 43 y 59. Su ndice de masa corporal es de 35,43 kg/m. Si esto est fuera del rango mencionado anteriormente, considere hacer un seguimiento con su proveedor de Midwife.  Si tiene 95 aos o menos, su ndice de [employer] corporal debe estar entre 89 y 53. Su ndice de masa corporal es de 35,43 kg/m. Si esto est fuera del rango mencionado anteriormente, considere hacer un seguimiento con su proveedor de Midwife.  ________________________________________________________  [employer] de [place] [facility] desean alentarlo a que use MYCHART para comunicarse con los proveedores para solicitudes o preguntas que no sean urgentes. Debido a los Astronomer de espera en el telfono, Conservator, museum/gallery  un mensaje a su proveedor por [employer] puede ser una forma ms rpida y eficiente de Tax adviser. Espere 48 horas hbiles para obtener Aetna. Recuerde que esto es para solicitudes no urgentes. _______________________________________________________  Debido a los  cambios recientes en las leyes de atencin mdica, es posible que vea los [place] de sus estudios de imgenes y de Designer, fashion/clothing en MyChart antes de que su proveedor haya tenido la oportunidad de revisarlos. Entendemos que en algunos casos puede haber resultados confusos o preocupantes para usted. No todos los resultados de laboratorio regresan en el mismo perodo de tiempo y el proveedor puede estar esperando mltiples resultados para Primary school teacher. Denos 65 horas para que su proveedor revise minuciosamente todos los resultados antes de comunicarse con la oficina para Audiological scientist.

## 2021-12-08 NOTE — Progress Notes (Signed)
Referring Provider: No ref. provider found Primary Care Physician:  Patient, No Pcp Per   Chief Complaint:  reflux, abdominal pain   IMPRESSION:  Persistent H pylori gastritis presenting with upper abdominal pain Reflux with associated globus Postprandial diarrhea - having 7 bowel movements daily History of colon polyp    - tubular adenoma on colonoscopy 2022    - surveillance colonoscopy recommended in 2029 Bright red blood attributed to hemorrhoids Microcytosis without anemia on labs in April 2021 Family history of stomach issues ? Gastric cancer (mother)   Confusion about diagnosis and treatment - complicated by language barrier. Reviewed pathology results and H pylori diagnosis.   PLAN: - levofloxacin 500 mg QD, amoxicillin 1000 mg BID taken 30 minutes after meals, and pantoprazole 40 mg BID x 14 days - H pylori stool antigen at least 2 weeks after completing antibiotics and performed off PPI - Office follow-up in 4-6 weeks, earlier with new symptoms   HPI: Jerry Pratt is a 41 y.o. male referred by the urgent care for abdominal pain.  The history is obtained to the patient with the help of interpreter and review of his electronic health record. He does not have a primary care provider. 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   Seen in the Urgent Care 02/19/2020 with a years-long history of intermittent stomach pain described as a burning sensation that occurs at the top of the throat radiating down to the epigastrium, a fullness in the throat, and upper abdominal pain.  No labs or imaging were performed at that time of the Urgent Care visit.  He was discharged on omeprazole 20 mg daily and famotidine 20 mg nightly. Symptoms are unchanged despite 100% adherence with that medical regimen.   He is also reporting many years of postprandial diarrhea and the new onset of intermittent BRB on the toilet paper.  He will have a bowel movement before he even finishes  eating. He is having 7-8 bowel movements daily. 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   No evidence for GI bleeding, iron deficiency anemia, anorexia, unexplained weight loss, dysphagia, odynophagia, persistent vomiting, or gastrointestinal cancer in a first-degree relative.   He notes some personal issues and some work issues. Stress may be worsening his symptoms.   Uses Tylenol. No NSAIDs. Recently on an "antibiotic"  for the flu that gets from [employer].   Most recent labs from 08/2019 show an abnormal CMP with a creatinine of 1.25 and a glucose of 119, MCV 77.4, hemoglobin 16.8, RDW 13.8, platelets 200  EGD 04/08/21: Duodenitis, otherwise endoscopically normal. Esophageal biopsies showed reflux, gastric biopsies showed H pylori, and duodenal biopsies were normal.   Colonoscopy 04/08/21: small rectal polyp, small ascending colon polyp, left sided diverticulosis. Random colon biopsies were negative. One polyp was a tubular adenoma, the other was a hyperplastic polyp.   Returns in follow-up after completing 10 days of treatment with tetracycline, metronidazole, and pantoprazole. There may have been some symptomatic improvement while on antibiotics but his symptoms quickly returned.  Unintentional weight gain since last visit.   No new complaints or concerns.   Mother died of stomach related problems. No family history of gastrointestinal cancer in a first-degree relative.  Past Medical History:  Diagnosis Date   COVID-19    GERD (gastroesophageal reflux disease)    Giardia     Past Surgical History:  Procedure Laterality Date   COLONOSCOPY     NO PAST SURGERIES  PYLOROPLASTY     UPPER GASTROINTESTINAL ENDOSCOPY       Current Outpatient Medications  Medication Sig Dispense Refill   amoxicillin (AMOXIL) 500 MG tablet Take 2 tablets (1,000 mg total) by mouth 2 (two) times daily for 14 days. 56 tablet 0   levofloxacin (LEVAQUIN) 500 MG tablet Take 1 tablet (500 mg  total) by mouth daily for 14 days. 14 tablet 0   famotidine (PEPCID) 20 MG tablet Take 1 tablet (20 mg total) by mouth at bedtime. (Patient not taking: Reported on 12/08/2021) 30 tablet 0   meloxicam (MOBIC) 7.5 MG tablet Take 1 tablet (7.5 mg total) by mouth daily. (Patient not taking: Reported on 12/08/2021) 30 tablet 0   methocarbamol (ROBAXIN) 750 MG tablet Take 1 tablet (750 mg total) by mouth every 8 (eight) hours as needed for muscle spasms. (Patient not taking: Reported on 12/08/2021) 60 tablet 1   pantoprazole (PROTONIX) 40 MG tablet Take 1 tablet (40 mg total) by mouth 2 (two) times daily for 14 days. 28 tablet 0   No current facility-administered medications for this visit.    Allergies as of 12/08/2021   (No Known Allergies)     Physical Exam: General:   Alert,  well-nourished, pleasant and cooperative in NAD Head:  Normocephalic and atraumatic. Eyes:  Sclera clear, no icterus.   Conjunctiva pink. Abdomen:  Soft, mild upper abdominal pain, nondistended, normal bowel sounds, no rebound or guarding. No hepatosplenomegaly.   Neurologic:  Alert and  oriented x4;  grossly nonfocal Skin:  Intact without significant lesions or rashes. Psych:  Alert and cooperative. Normal mood and affect.    Brindle Leyba L. Tarri Glenn, MD, MPH 12/08/2021, 9:37 PM

## 2021-12-27 ENCOUNTER — Other Ambulatory Visit: Payer: Commercial Managed Care - HMO

## 2021-12-27 DIAGNOSIS — K219 Gastro-esophageal reflux disease without esophagitis: Secondary | ICD-10-CM

## 2021-12-27 DIAGNOSIS — A048 Other specified bacterial intestinal infections: Secondary | ICD-10-CM

## 2021-12-28 LAB — HELICOBACTER PYLORI  SPECIAL ANTIGEN
MICRO NUMBER:: 13807724
SPECIMEN QUALITY: ADEQUATE

## 2022-01-31 ENCOUNTER — Encounter: Payer: Self-pay | Admitting: Gastroenterology

## 2022-01-31 ENCOUNTER — Ambulatory Visit: Payer: Commercial Managed Care - HMO | Admitting: Gastroenterology

## 2022-01-31 ENCOUNTER — Other Ambulatory Visit (INDEPENDENT_AMBULATORY_CARE_PROVIDER_SITE_OTHER): Payer: Commercial Managed Care - HMO

## 2022-01-31 VITALS — BP 122/80 | HR 95 | Ht 68.0 in | Wt 234.5 lb

## 2022-01-31 DIAGNOSIS — K219 Gastro-esophageal reflux disease without esophagitis: Secondary | ICD-10-CM

## 2022-01-31 DIAGNOSIS — D509 Iron deficiency anemia, unspecified: Secondary | ICD-10-CM | POA: Diagnosis not present

## 2022-01-31 DIAGNOSIS — A048 Other specified bacterial intestinal infections: Secondary | ICD-10-CM

## 2022-01-31 DIAGNOSIS — K529 Noninfective gastroenteritis and colitis, unspecified: Secondary | ICD-10-CM

## 2022-01-31 DIAGNOSIS — R1011 Right upper quadrant pain: Secondary | ICD-10-CM

## 2022-01-31 DIAGNOSIS — G8929 Other chronic pain: Secondary | ICD-10-CM

## 2022-01-31 LAB — CBC WITH DIFFERENTIAL/PLATELET
Basophils Absolute: 0.1 10*3/uL (ref 0.0–0.1)
Basophils Relative: 1.2 % (ref 0.0–3.0)
Eosinophils Absolute: 0.2 10*3/uL (ref 0.0–0.7)
Eosinophils Relative: 3.2 % (ref 0.0–5.0)
HCT: 46.4 % (ref 39.0–52.0)
Hemoglobin: 15.8 g/dL (ref 13.0–17.0)
Lymphocytes Relative: 39.2 % (ref 12.0–46.0)
Lymphs Abs: 2.9 10*3/uL (ref 0.7–4.0)
MCHC: 34.1 g/dL (ref 30.0–36.0)
MCV: 76.6 fl — ABNORMAL LOW (ref 78.0–100.0)
Monocytes Absolute: 1 10*3/uL (ref 0.1–1.0)
Monocytes Relative: 13 % — ABNORMAL HIGH (ref 3.0–12.0)
Neutro Abs: 3.2 10*3/uL (ref 1.4–7.7)
Neutrophils Relative %: 43.4 % (ref 43.0–77.0)
Platelets: 220 10*3/uL (ref 150.0–400.0)
RBC: 6.06 Mil/uL — ABNORMAL HIGH (ref 4.22–5.81)
RDW: 13.8 % (ref 11.5–15.5)
WBC: 7.4 10*3/uL (ref 4.0–10.5)

## 2022-01-31 LAB — IBC + FERRITIN
Ferritin: 186.2 ng/mL (ref 22.0–322.0)
Iron: 111 ug/dL (ref 42–165)
Saturation Ratios: 39.1 % (ref 20.0–50.0)
TIBC: 284.2 ug/dL (ref 250.0–450.0)
Transferrin: 203 mg/dL — ABNORMAL LOW (ref 212.0–360.0)

## 2022-01-31 MED ORDER — DICYCLOMINE HCL 10 MG PO CAPS: 10.0000 mg | ORAL_CAPSULE | Freq: Three times a day (TID) | ORAL | 3 refills | Status: AC

## 2022-01-31 MED ORDER — PANTOPRAZOLE SODIUM 40 MG PO TBEC: 40.0000 mg | DELAYED_RELEASE_TABLET | Freq: Every day | ORAL | 11 refills | Status: AC

## 2022-01-31 NOTE — Progress Notes (Unsigned)
Referring Provider: No ref. provider found Primary Care Physician:  Patient, No Pcp Per   Chief Complaint:  reflux, abdominal pain   IMPRESSION:  Persistent upper abdominal pain despite resolution of H pylori Reflux with associated globus Postprandial diarrhea - having 7 bowel movements daily History of colon polyp    - tubular adenoma on colonoscopy 2022    - surveillance colonoscopy recommended in 2029 Bright red blood attributed to hemorrhoids Microcytosis without anemia on labs in April 2021 and May 2023 Family history of stomach issues ? Gastric cancer (mother)   PLAN: - Pantoprazole 40 mg QD - Dicyclomine 10 mg QID taken prior to meals - Abdominal ultrasound - CBC, iron, ferritin - HIDA with CCK if ultrasound is negative - Office follow-up in 4-6 weeks, earlier with new symptoms - Establish care with primary care provider   HPI: Jerry Pratt is a 41 y.o. male referred by the urgent care for abdominal pain.  The history is obtained to the patient with the help of interpreter and review of his electronic health record. He does not have a primary care provider. 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   Seen in the Urgent Care 02/19/2020 with a years-long history of intermittent stomach pain described as a burning sensation that occurs at the top of the throat radiating down to the epigastrium, a fullness in the throat, and upper abdominal pain.  No labs or imaging were performed at that time of the Urgent Care visit.  He was discharged on omeprazole 20 mg daily and famotidine 20 mg nightly. Symptoms are unchanged despite 100% adherence with that medical regimen.   He is also reporting many years of postprandial diarrhea and the new onset of intermittent BRB on the toilet paper.  He will have a bowel movement before he even finishes eating. He is having 7-8 bowel movements daily. 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   No evidence for GI bleeding, iron  deficiency anemia, anorexia, unexplained weight loss, dysphagia, odynophagia, persistent vomiting, or gastrointestinal cancer in a first-degree relative.   He notes some personal issues and some work issues. Stress may be worsening his symptoms.   Uses Tylenol. No NSAIDs. Recently on an "antibiotic"  for the flu that gets from [employer].   Most recent labs from 08/2019 show an abnormal CMP with a creatinine of 1.25 and a glucose of 119, MCV 77.4, hemoglobin 16.8, RDW 13.8, platelets 200  EGD 04/08/21: Duodenitis, otherwise endoscopically normal. Esophageal biopsies showed reflux, gastric biopsies showed H pylori, and duodenal biopsies were normal.   Colonoscopy 04/08/21: small rectal polyp, small ascending colon polyp, left sided diverticulosis. Random colon biopsies were negative. One polyp was a tubular adenoma, the other was a hyperplastic polyp.   Returns in follow-up after completing 10 days of treatment with tetracycline, metronidazole, and pantoprazole. There may have been some symptomatic improvement while on antibiotics but his symptoms quickly returned.  Unintentional weight gain since last visit.   Postprandial abdominal pain and bloating and diarrhea Feeling better while he was taking Hpylori treatment.   No new complaints or concerns.   Mother died of stomach related problems. No family history of gastrointestinal cancer in a first-degree relative.  Past Medical History:  Diagnosis Date   COVID-19    GERD (gastroesophageal reflux disease)    Giardia     Past Surgical History:  Procedure Laterality Date   COLONOSCOPY     NO PAST SURGERIES     PYLOROPLASTY  UPPER GASTROINTESTINAL ENDOSCOPY       No current outpatient medications on file.   No current facility-administered medications for this visit.    Allergies as of 01/31/2022   (No Known Allergies)     Physical Exam: General:   Alert,  well-nourished, pleasant and cooperative in NAD Head:   Normocephalic and atraumatic. Eyes:  Sclera clear, no icterus.   Conjunctiva pink. Abdomen:  Soft, mild upper abdominal pain, nondistended, normal bowel sounds, no rebound or guarding. No hepatosplenomegaly.   Neurologic:  Alert and  oriented x4;  grossly nonfocal Skin:  Intact without significant lesions or rashes. Psych:  Alert and cooperative. Normal mood and affect.    Laquinta Hazell L. Tarri Glenn, MD, MPH 01/31/2022, 2:59 PM

## 2022-01-31 NOTE — Patient Instructions (Signed)
Fue un placer brindarle atencin hoy. Con base en nuestra discusin, le proporciono mis recomendaciones a continuacin:  RECOMENDACIN(ES):  Le he recomendado una ecografa para evaluar su vescula biliar y su pncreas.  Mientras tanto, recomiendo tomar pantoprazol todas las [employer] durante al [employer].  Tambin recomiendo una prueba de diciclomina antes de las comidas para ver si podemos prevenir los sntomas que ocurren al comer. El efecto secundario ms comn de la diciclomina es sequedad de [place].  Le recomiendo que establezca atencin con un mdico de cabecera para el cuidado de salud de rutina y la prevencin de enfermedades.  ULTRASONIDO ABDOMINAL:   Se le ha programado una ecografa abdominal en [facility] (primer piso del hospital) el lunes 06/18/21 a las 8 am. Llegue a las 7:45 am para registrarse.  DEBERES:   NO coma ni beba nada 6 horas antes de su prueba.  NECESITAS REPROGRAMAR?   Llame a radiologa al [phone].  HACER UN SEGUIMIENTO:   Me gustara que hicieras un seguimiento conmigo en 4 a 6 semanas. Llame a la oficina al [phone] para programar su cita.   IMC:   Si tiene 65 aos o ms, su ndice de masa corporal debe estar entre 23 y 54. Su ndice de masa corporal es de 35,66 kg/m. Si esto est fuera del rango mencionado anteriormente, considere realizar un seguimiento con su proveedor de Midwife.   Si tiene 38 aos o menos, su ndice de [employer] corporal debe estar entre 12 y 8. Su ndice de masa corporal es de 35,66 kg/m. Si esto est fuera del rango mencionado anteriormente, considere realizar un seguimiento con su proveedor de Midwife.  MI GRFICO:  Los proveedores de Financial controller GI desean alentarlo a Risk manager MYCHART para comunicarse con los proveedores para solicitudes o preguntas que no sean urgentes. Debido a los Astronomer de espera en el telfono, enviar un mensaje a su proveedor a travs de [employer] puede  ser Ardelia Mems manera ms rpida y eficiente de obtener una respuesta. Espere 48 horas hbiles para recibir Aetna. Recuerde que esto es para solicitudes no urgentes.  Gracias por confiarme tu cuidado gastrointestinal!  Thornton Park, MD, MPH  ______________________________________________________________________  It was my pleasure to provide care to you today. Based on our discussion, I am providing you with my recommendations below:  RECOMMENDATION(S):   I have recommended an ultrasound to evaluate your gallbladder and your pancreas.  In the meantime, I recommend pantoprazole taken every morning for at least two weeks.  I also recommend a trial of dicyclomine taken prior to meals to see if we can prevent the symptoms that happen with eating. The most common side effect with dicyclomine is a dry mouth.   I recommend that you establish care with a regular doctor for routine health care and disease prevention.  ABDOMINAL ULTRASOUND:  You have been scheduled for an abdominal ultrasound at [facility] (1st floor of the hospital) on Monday 02/07/22 at 8 am. Please arrive @ 7:45 am for registration.   PREP:   Please DO NOT eat or drink anything 6 hours prior to your test.  NEED TO RESCHEDULE?   Please call radiology at [phone].  FOLLOW UP:  I would like for you to follow up with me in 4-6 weeks. Please call the office at [phone] to schedule your appointment.   BMI:  If you are age 25 or older, your body mass index should be between 23-30. Your Body mass index is  35.66 kg/m. If this is out of the aforementioned range listed, please consider follow up with your Primary Care Provider.  If you are age 52 or younger, your body mass index should be between 19-25. Your Body mass index is 35.66 kg/m. If this is out of the aformentioned range listed, please consider follow up with your Primary Care Provider.   MY CHART:  The [place] providers would like  to encourage you to use [facility] to communicate with providers for non-urgent requests or questions.  Due to long hold times on the telephone, sending your provider a message by [facility] may be a faster and more efficient way to get a response.  Please allow 48 business hours for a response.  Please remember that this is for non-urgent requests.   Thank you for trusting me with your gastrointestinal care!    Thornton Park, MD, MPH

## 2022-02-07 ENCOUNTER — Ambulatory Visit (HOSPITAL_COMMUNITY)
Admission: RE | Admit: 2022-02-07 | Discharge: 2022-02-07 | Disposition: A | Discharge status: Home / Self Care | Payer: Commercial Managed Care - HMO | Source: Ambulatory Visit | Attending: Gastroenterology | Admitting: Gastroenterology

## 2022-02-07 DIAGNOSIS — G8929 Other chronic pain: Secondary | ICD-10-CM | POA: Insufficient documentation

## 2022-02-07 DIAGNOSIS — D509 Iron deficiency anemia, unspecified: Secondary | ICD-10-CM | POA: Diagnosis present

## 2022-02-07 DIAGNOSIS — R1011 Right upper quadrant pain: Secondary | ICD-10-CM | POA: Diagnosis present

## 2022-02-07 DIAGNOSIS — A048 Other specified bacterial intestinal infections: Secondary | ICD-10-CM | POA: Insufficient documentation

## 2022-02-07 DIAGNOSIS — K219 Gastro-esophageal reflux disease without esophagitis: Secondary | ICD-10-CM | POA: Insufficient documentation

## 2022-02-07 DIAGNOSIS — K529 Noninfective gastroenteritis and colitis, unspecified: Secondary | ICD-10-CM | POA: Insufficient documentation

## 2022-02-08 ENCOUNTER — Telehealth: Payer: Self-pay

## 2022-02-08 ENCOUNTER — Other Ambulatory Visit: Payer: Self-pay

## 2022-02-08 DIAGNOSIS — G8929 Other chronic pain: Secondary | ICD-10-CM

## 2022-02-08 DIAGNOSIS — R9389 Abnormal findings on diagnostic imaging of other specified body structures: Secondary | ICD-10-CM

## 2022-02-08 NOTE — Telephone Encounter (Signed)
Received call from [facility] with [facility] that patient's [place] results are available & MD will need to review.

## 2022-02-08 NOTE — Telephone Encounter (Signed)
Spoke with patient regarding results & recommendations with interpreter services. Orders placed & schedulers notified. Patient provided scheduling number & aware to call if he has not heard from them within 1 week.

## 2022-02-08 NOTE — Addendum Note (Signed)
Addended by: Bonner Puna H on: 02/08/2022 12:36 PM   Modules accepted: Orders

## 2022-02-24 NOTE — Telephone Encounter (Signed)
MRI scheduled for 03/02/22 at 7 pm. Called pt using interpreter service and let him know he needs to stop eating and drinking at 3 pm and he needs to arrive at [facility] hospital at 6:30 pm. Pt verbalized understanding.

## 2022-02-24 NOTE — Telephone Encounter (Signed)
Patent called states he has not heard from anyone to schedule from radiology please advise.

## 2022-03-01 ENCOUNTER — Telehealth: Payer: Self-pay | Admitting: Gastroenterology

## 2022-03-01 ENCOUNTER — Encounter: Payer: Self-pay | Admitting: Gastroenterology

## 2022-03-01 NOTE — Telephone Encounter (Signed)
Inbound call from patients insurance stating that patient needs to have MRI orders sent to [facility] due to his insurance not covering the MRI at [facility] on 11/1. Please advise.

## 2022-03-02 ENCOUNTER — Ambulatory Visit (HOSPITAL_COMMUNITY): Payer: Commercial Managed Care - HMO

## 2022-03-02 ENCOUNTER — Other Ambulatory Visit: Payer: Self-pay

## 2022-03-02 DIAGNOSIS — R9389 Abnormal findings on diagnostic imaging of other specified body structures: Secondary | ICD-10-CM

## 2022-03-02 DIAGNOSIS — K529 Noninfective gastroenteritis and colitis, unspecified: Secondary | ICD-10-CM

## 2022-03-02 DIAGNOSIS — G8929 Other chronic pain: Secondary | ICD-10-CM

## 2022-03-02 NOTE — Telephone Encounter (Signed)
MRI cancelled for [facility] & new order placed for [facility]. Patient is aware & advised that [facility] will contact him. Address & number for their office provided if he has not heard from them within the week.

## 2022-03-09 ENCOUNTER — Ambulatory Visit (HOSPITAL_COMMUNITY): Payer: Commercial Managed Care - HMO

## 2022-03-23 ENCOUNTER — Ambulatory Visit: Payer: Commercial Managed Care - HMO | Admitting: Gastroenterology

## 2022-03-28 ENCOUNTER — Ambulatory Visit
Admission: RE | Admit: 2022-03-28 | Discharge: 2022-03-28 | Disposition: A | Discharge status: Home / Self Care | Payer: Commercial Managed Care - HMO | Source: Ambulatory Visit | Attending: Gastroenterology | Admitting: Gastroenterology

## 2022-03-28 DIAGNOSIS — R9389 Abnormal findings on diagnostic imaging of other specified body structures: Secondary | ICD-10-CM

## 2022-03-28 DIAGNOSIS — G8929 Other chronic pain: Secondary | ICD-10-CM

## 2022-03-28 MED ORDER — GADOPICLENOL 0.5 MMOL/ML IV SOLN
10.0000 mL | Freq: Once | INTRAVENOUS | Status: AC | PRN
  Administered 2022-03-28: 10 mL via INTRAVENOUS

## 2022-05-12 ENCOUNTER — Other Ambulatory Visit (HOSPITAL_COMMUNITY): Payer: Self-pay

## 2022-05-12 ENCOUNTER — Telehealth: Payer: Self-pay | Admitting: Pharmacy Technician

## 2022-05-12 NOTE — Telephone Encounter (Signed)
Patient Advocate Encounter  Received notification from [facility] that prior authorization for PANTOPRAZOLE '40MG'$  is required.   PA submitted on 1.4.24 [facility]  Status is pending

## 2022-05-18 ENCOUNTER — Ambulatory Visit: Payer: Commercial Managed Care - HMO | Admitting: Gastroenterology

## 2022-09-27 ENCOUNTER — Telehealth: Payer: Self-pay

## 2022-09-27 NOTE — Telephone Encounter (Signed)
Dr. Christain Sacramento Pt. Reminder received in Epic.( Note Below) As DOD Please advise on recommendations to proceed with ordering follow up MRI:

## 2022-09-27 NOTE — Telephone Encounter (Signed)
Message Received: Today Chnupa, Perlie Gold, RN  Chnupa, Perlie Gold, RN Patient needs 6 month follow up MRI. Order needs to be placed. Refer to imaging note 03/28/22.

## 2022-09-27 NOTE — Telephone Encounter (Signed)
I recommend that we schedule an office visit with any available provider to determine next steps which could but do not have to include repeat MRI in my opinion  It is not urgent so July is acceptable.

## 2022-09-28 NOTE — Telephone Encounter (Signed)
Through a spanish interpreter Unable to reach pt. Unable to leave voice mail due to voice mail box being full.

## 2022-09-29 NOTE — Telephone Encounter (Signed)
Unable to reach patient with interpreter services. VM box full.

## 2022-10-05 NOTE — Telephone Encounter (Signed)
Through a spanish interpreter pt was made aware of Dr. Leone Payor recommendations: Pt was scheduled for an office visit on 12/22/2022 at 11:00 AM with Alcide Evener NP. Pt made aware. Pt verbalized understanding with all questions answered.

## 2022-10-28 NOTE — Telephone Encounter (Signed)
Patient Advocate Encounter  Prior Authorization for PANTOPRAZOLE 40MG  has been approved with [employer].    PA#  Effective dates: 1.4.24 through 1.2.25

## 2022-12-22 ENCOUNTER — Ambulatory Visit: Payer: Self-pay | Admitting: Nurse Practitioner

## 2023-05-14 ENCOUNTER — Encounter (HOSPITAL_COMMUNITY): Payer: Self-pay | Admitting: Emergency Medicine

## 2023-05-14 ENCOUNTER — Emergency Department (HOSPITAL_COMMUNITY)
Admission: EM | Admit: 2023-05-14 | Discharge: 2023-05-14 | Disposition: A | Discharge status: Home / Self Care | Payer: Self-pay | Attending: Emergency Medicine | Admitting: Emergency Medicine

## 2023-05-14 DIAGNOSIS — J069 Acute upper respiratory infection, unspecified: Secondary | ICD-10-CM | POA: Insufficient documentation

## 2023-05-14 LAB — CBG MONITORING, ED: Glucose-Capillary: 132 mg/dL — ABNORMAL HIGH (ref 70–99)

## 2023-05-14 MED ORDER — AZITHROMYCIN 250 MG PO TABS
500.0000 mg | ORAL_TABLET | Freq: Once | ORAL | Status: AC
  Administered 2023-05-14: 500 mg via ORAL
  Filled 2023-05-14: qty 2

## 2023-05-14 MED ORDER — AZITHROMYCIN 250 MG PO TABS: ORAL_TABLET | ORAL | 0 refills | Status: AC

## 2023-05-14 NOTE — ED Triage Notes (Signed)
 Pt endorses dry cough for a month. Lower back pain noticed when he was getting in car today. Other family sick in the house. Pt also worried about snoring at night and dry throat.

## 2023-05-14 NOTE — ED Provider Notes (Signed)
 [place] EMERGENCY DEPARTMENT AT [facility] Provider Note   CSN: [id number] Arrival date & time: 05/14/23  1315     History  Chief Complaint  Patient presents with   Cough    Jerry Pratt is a 43 y.o. male.  Patient to ED with symptoms for the past one month of cough that is nonproductive but persistent. He endorses bilateral rib discomfort associated with cough. He is having significant dry throat, constantly feeling he needs to drink. He reports getting up once at night to urinate. No vomiting, diarrhea.   The history is provided by the patient. A language interpreter was used (via video interpreter).  Cough      Home Medications Prior to Admission medications   Medication Sig Start Date End Date Taking? Authorizing Provider  azithromycin  (ZITHROMAX ) 250 MG tablet Take 1 every day, starting tomorrow, for the next 4 days until finished. 05/14/23  Yes Kristeen Lantz, Margit, PA-C  dicyclomine  (BENTYL ) 10 MG capsule Take 1 capsule (10 mg total) by mouth 4 (four) times daily -  before meals and at bedtime. 01/31/22   Eda Iha, MD  pantoprazole  (PROTONIX ) 40 MG tablet Take 1 tablet (40 mg total) by mouth daily. 01/31/22   Eda Iha, MD      Allergies    Patient has no known allergies.    Review of Systems   Review of Systems  Respiratory:  Positive for cough.     Physical Exam Updated Vital Signs BP (!) 118/105 (BP Location: Left Arm)   Pulse (!) 113   Temp 98.2 F (36.8 C) (Oral)   Resp 18  Physical Exam Vitals and nursing note reviewed.  Constitutional:      Appearance: He is well-developed.  HENT:     Head: Normocephalic.     Mouth/Throat:     Pharynx: Posterior oropharyngeal erythema present.  Cardiovascular:     Rate and Rhythm: Normal rate and regular rhythm.     Heart sounds: No murmur heard. Pulmonary:     Effort: Pulmonary effort is normal.     Breath sounds: Normal breath sounds. No wheezing, rhonchi or rales.  Abdominal:      General: Bowel sounds are normal.     Palpations: Abdomen is soft.     Tenderness: There is no abdominal tenderness. There is no guarding or rebound.  Musculoskeletal:        General: Normal range of motion.     Cervical back: Normal range of motion and neck supple.  Skin:    General: Skin is warm and dry.  Neurological:     General: No focal deficit present.     Mental Status: He is alert and oriented to person, place, and time.     ED Results / Procedures / Treatments   Labs (all labs ordered are listed, but only abnormal results are displayed) Labs Reviewed  CBG MONITORING, ED - Abnormal; Notable for the following components:      Result Value   Glucose-Capillary 132 (*)    All other components within normal limits    EKG None  Radiology No results found.  Procedures Procedures    Medications Ordered in ED Medications  azithromycin  (ZITHROMAX ) tablet 500 mg (500 mg Oral Given 05/14/23 1509)    ED Course/ Medical Decision Making/ A&P Clinical Course as of 05/14/23 1604  Sun May 14, 2023  1444 Patient to ED with one month of cough, clear nasal drainage, no fever. Nonsmoker but works in smoking environment. No  other medical history. Also reports constant, excessive thirst for the past 2-3 months. Father with diabetes.  [SU]  1559 CBG 132 - negative for diabetes. Exam reassuring. Cough Sxs for one month, will treat with abx based on duration of symptoms. Will give Tessalon. Encourage PCP follow up.  [SU]    Clinical Course User Index [SU] Odell Balls, PA-C                                 Medical Decision Making Risk Prescription drug management.           Final Clinical Impression(s) / ED Diagnoses Final diagnoses:  Upper respiratory tract infection, unspecified type    Rx / DC Orders ED Discharge Orders          Ordered    azithromycin  (ZITHROMAX ) 250 MG tablet        05/14/23 1602              Odell Balls, PA-C 05/14/23 1604     Towana Ozell BROCKS, MD 05/14/23 1724

## 2023-05-14 NOTE — Discharge Instructions (Addendum)
 Your test for diabetes is negative - you do not have diabetes. As we discussed, I am treating your cough with antibiotics based on duration of symptoms. Take Zithromax  as prescribed, and Tessalon for cough. Follow up with your doctor if symptoms persist.   Su prueba de diabetes es negativa: no tiene diabetes. Como comentamos, estoy tratando su tos con antibiticos segn la duracin de los sntomas. Tome Zithromax  segn lo recetado y Tessalon para la tos. Haga un seguimiento con su mdico si los sntomas persisten.

## 2023-05-23 ENCOUNTER — Ambulatory Visit (INDEPENDENT_AMBULATORY_CARE_PROVIDER_SITE_OTHER): Payer: Self-pay | Admitting: Nurse Practitioner

## 2023-05-23 ENCOUNTER — Encounter: Payer: Self-pay | Admitting: Nurse Practitioner

## 2023-05-23 ENCOUNTER — Ambulatory Visit: Payer: Self-pay

## 2023-05-23 VITALS — BP 138/90 | HR 100 | Temp 97.2°F | Wt 237.6 lb

## 2023-05-23 DIAGNOSIS — K6289 Other specified diseases of anus and rectum: Secondary | ICD-10-CM

## 2023-05-23 DIAGNOSIS — K625 Hemorrhage of anus and rectum: Secondary | ICD-10-CM

## 2023-05-23 NOTE — Telephone Encounter (Signed)
 Interpreter Marinda # [phone]  Chief Complaint: rectal bleeding  Symptoms: having blood with stools, has frequent soft stools, had abdominal pain but resolved  Frequency: 1 week  Pertinent Negatives: Patient denies vomiting or diarrhea or constipation Disposition: [] ED /[] Urgent Care (no appt availability in office) / [x] Appointment(In office/virtual)/ []  [facility]/ [] Home Care/ [] Refused Recommended Disposition /[]  Mobile Bus/ []  Follow-up with PCP Additional Notes: pt states when he has stools the water turns red. Has been going on 1 week. He had abdominal pain but not present today. Called and spoke with Raenette Sakata E2C2 agent, verified that pt still pt at [facility], was to schedule OV today at 1320. Confirmed with pt and provided address.    Reason for Disposition  MODERATE rectal bleeding (small blood clots, passing blood without stool, or toilet water turns red)  Answer Assessment - Initial Assessment Questions 1. APPEARANCE of BLOOD: What color is it? Is it passed separately, on the surface of the stool, or mixed in with the stool?      Mixed in stool  3. FREQUENCY: How many times has blood been passed with the stools?      Every stool  4. ONSET: When was the blood first seen in the stools? (Days or weeks)      1 week  5. DIARRHEA: Is there also some diarrhea? If Yes, ask: How many diarrhea stools in the past 24 hours?      Not loose but soft  6. CONSTIPATION: Do you have constipation? If Yes, ask: How bad is it?     no 8. BLOOD THINNERS: Do you take any blood thinners? (e.g., Coumadin/warfarin, Pradaxa/dabigatran, aspirin)     no 9. OTHER SYMPTOMS: Do you have any other symptoms?  (e.g., abdomen pain, vomiting, dizziness, fever)     Abdomen pain but not present  Protocols used: Rectal Bleeding-A-AH

## 2023-05-23 NOTE — Progress Notes (Signed)
 Subjective   Patient ID: Jerry Pratt, male    DOB: May 31, 1980, 43 y.o.   MRN: [id number]  Chief Complaint  Patient presents with   Rectal Bleeding    X 1 week    Referring provider: No ref. provider found  Jerry Pratt is a 43 y.o. male with Past Medical History: No date: COVID-19 No date: GERD (gastroesophageal reflux disease) No date: Giardia   Rectal Bleeding  The current episode started 5 to 7 days ago. The onset was sudden. The problem occurs continuously. The problem has been unchanged. The pain is moderate. The stool is described as bloody. There was no prior successful therapy. There was no prior unsuccessful therapy. Pertinent negatives include no vomiting.   Note: Patient denies any recent constipation. Upon exam there are no external hemorrhoids.    No Known Allergies  Immunization History  Administered Date(s) Administered   Tdap 08/21/2019    Tobacco History: Social History   Tobacco Use  Smoking Status Former   Current packs/day: 0.00   Types: Cigarettes   Quit date: 2019   Years since quitting: 6.0  Smokeless Tobacco Never  Tobacco Comments   States smoked cigarettes on weekends for 7-8 years and quit smoking in 2019   Counseling given: Not Answered Tobacco comments: States smoked cigarettes on weekends for 7-8 years and quit smoking in 2019   Outpatient Encounter Medications as of 05/23/2023  Medication Sig   azithromycin  (ZITHROMAX ) 250 MG tablet Take 1 every day, starting tomorrow, for the next 4 days until finished. (Patient not taking: Reported on 05/23/2023)   dicyclomine  (BENTYL ) 10 MG capsule Take 1 capsule (10 mg total) by mouth 4 (four) times daily -  before meals and at bedtime. (Patient not taking: Reported on 05/23/2023)   pantoprazole  (PROTONIX ) 40 MG tablet Take 1 tablet (40 mg total) by mouth daily. (Patient not taking: Reported on 05/23/2023)   No facility-administered encounter medications on file as of 05/23/2023.    Review  of Systems  Review of Systems  Constitutional: Negative.   HENT: Negative.    Cardiovascular: Negative.   Gastrointestinal:  Positive for blood in stool and hematochezia. Negative for constipation and vomiting.  Allergic/Immunologic: Negative.   Neurological: Negative.   Psychiatric/Behavioral: Negative.       Objective:   BP (!) 138/90   Pulse 100   Temp (!) 97.2 F (36.2 C)   Wt 237 lb 9.6 oz (107.8 kg)   SpO2 96%   BMI 36.13 kg/m   Wt Readings from Last 5 Encounters:  05/23/23 237 lb 9.6 oz (107.8 kg)  01/31/22 234 lb 8 oz (106.4 kg)  12/08/21 233 lb (105.7 kg)  08/05/21 224 lb 2 oz (101.7 kg)  04/08/21 218 lb (98.9 kg)     Physical Exam Vitals and nursing note reviewed.  Constitutional:      General: He is not in acute distress.    Appearance: He is well-developed.  Cardiovascular:     Rate and Rhythm: Normal rate and regular rhythm.  Pulmonary:     Effort: Pulmonary effort is normal.     Breath sounds: Normal breath sounds.  Skin:    General: Skin is warm and dry.  Neurological:     Mental Status: He is alert and oriented to person, place, and time.       Assessment & Plan:   Rectal bleeding -     CBC -     Comprehensive metabolic panel -     Ambulatory  referral to Gastroenterology  Rectal pain -     Ambulatory referral to Gastroenterology     Return in about 3 months (around 08/21/2023) for Physical.   Bascom GORMAN Borer, NP 05/23/2023

## 2023-05-23 NOTE — Patient Instructions (Signed)
 1. Rectal bleeding (Primary)  - CBC - Comprehensive metabolic panel - Ambulatory referral to Gastroenterology  2. Rectal pain  - Ambulatory referral to Gastroenterology  Follow up:  Follow up in 3 months for physical

## 2023-05-24 ENCOUNTER — Other Ambulatory Visit: Payer: Self-pay | Admitting: Nurse Practitioner

## 2023-05-24 LAB — COMPREHENSIVE METABOLIC PANEL
ALT: 59 [IU]/L — ABNORMAL HIGH (ref 0–44)
AST: 42 [IU]/L — ABNORMAL HIGH (ref 0–40)
Albumin: 4.4 g/dL (ref 4.1–5.1)
Alkaline Phosphatase: 78 [IU]/L (ref 44–121)
BUN/Creatinine Ratio: 11 (ref 9–20)
BUN: 14 mg/dL (ref 6–24)
Bilirubin Total: 0.5 mg/dL (ref 0.0–1.2)
CO2: 20 mmol/L (ref 20–29)
Calcium: 9.8 mg/dL (ref 8.7–10.2)
Chloride: 104 mmol/L (ref 96–106)
Creatinine, Ser: 1.33 mg/dL — ABNORMAL HIGH (ref 0.76–1.27)
Globulin, Total: 2.9 g/dL (ref 1.5–4.5)
Glucose: 111 mg/dL — ABNORMAL HIGH (ref 70–99)
Potassium: 4.2 mmol/L (ref 3.5–5.2)
Sodium: 142 mmol/L (ref 134–144)
Total Protein: 7.3 g/dL (ref 6.0–8.5)
eGFR: 67 mL/min/{1.73_m2} (ref >59)

## 2023-05-24 LAB — CBC
Hematocrit: 51.8 % — ABNORMAL HIGH (ref 37.5–51.0)
Hemoglobin: 16.8 g/dL (ref 13.0–17.7)
MCH: 25.1 pg — ABNORMAL LOW (ref 26.6–33.0)
MCHC: 32.4 g/dL (ref 31.5–35.7)
MCV: 78 fL — ABNORMAL LOW (ref 79–97)
Platelets: 287 10*3/uL (ref 150–450)
RBC: 6.68 x10E6/uL — ABNORMAL HIGH (ref 4.14–5.80)
RDW: 14.8 % (ref 11.6–15.4)
WBC: 10.2 10*3/uL (ref 3.4–10.8)

## 2023-05-24 MED ORDER — HYDROCORTISONE ACETATE 25 MG RE SUPP: 25.0000 mg | Freq: Two times a day (BID) | RECTAL | 1 refills | Status: AC

## 2023-05-30 ENCOUNTER — Ambulatory Visit (INDEPENDENT_AMBULATORY_CARE_PROVIDER_SITE_OTHER): Payer: Self-pay | Admitting: Gastroenterology

## 2023-05-30 ENCOUNTER — Encounter: Payer: Self-pay | Admitting: Gastroenterology

## 2023-05-30 ENCOUNTER — Other Ambulatory Visit: Payer: Self-pay

## 2023-05-30 VITALS — BP 122/80 | HR 100 | Ht 68.0 in | Wt 240.0 lb

## 2023-05-30 DIAGNOSIS — K640 First degree hemorrhoids: Secondary | ICD-10-CM

## 2023-05-30 DIAGNOSIS — K648 Other hemorrhoids: Secondary | ICD-10-CM

## 2023-05-30 DIAGNOSIS — K580 Irritable bowel syndrome with diarrhea: Secondary | ICD-10-CM

## 2023-05-30 MED ORDER — AMITRIPTYLINE HCL 10 MG PO TABS: 10.0000 mg | ORAL_TABLET | Freq: Every day | ORAL | 1 refills | Status: AC

## 2023-05-30 NOTE — Progress Notes (Signed)
Discussed the use of AI scribe software for clinical note transcription with the patient, who gave verbal consent to proceed.  HPI : Jerry Pratt is a 43 y.o. male with chronic abdominal pain, symptomatic hemorrhoids and diarrhea who is referred to [place] by Ivonne Andrew, NP for further evaluation of these symptoms.  He primarily speaks Spanish and so is accompanied by a [place] language interpreter.  He was previously seen by Dr. Orvan Falconer in 2022 and 2023.  He underwent an EGD and colonoscopy in Dec 2022.  The EGD were notable for H.pylori gastritis and duodenitis and histologic evidence of reflux esophagitis.  The colonoscopy showed normal colonic mucosa with normal biopsies.  Two small polyps were removed, one was a TA.  The H. Pylori persisted after treatment with quadruple therapy, but was eradicated with Levofloxacin/amoxicillin.  Today, he reports ongoing similar gastrointestinal issues, primarily characterized by frequent bowel movements, up to eight times a day or more, and abdominal bloating. These symptoms have been persistent for several years. The patient reports that bowel movements are typically soft or diarrheal in nature, and occur shortly after eating. He also notes that consumption of salty foods exacerbates the frequency of bowel movements.  He complains of abdominal distention following meals which is very distressing.  He denies constipation.  The patient has also been experiencing intermittent rectal bleeding, which he initially attributed to potential issues with the colon or liver. However, it was clarified that the bleeding is due to internal hemorrhoids. The patient has been using over-the-counter suppositories for the hemorrhoids, but only when the bleeding is severe.  In addition to these symptoms, the patient reports a burning sensation in the abdomen, which he has been associating with the use of an over-the-counter medication for erectile dysfunction. This medication, a  blue pill of 100mg , is taken as needed, but the patient has noticed that it often results in significant abdominal pain the following day.  The patient has a family history of gastrointestinal issues, with his mother having passed away due to complications from stomach perforations. This history has led to significant concern and anxiety about his own gastrointestinal health.  The patient has been working night shifts, from 10 PM to 3 AM, which may also be contributing to his gastrointestinal symptoms. Despite these challenges, the patient is eager to find a solution to his symptoms and is open to trying new medications and treatments.   He does have a history of fatty liver as well.      EGD 04/08/21: Duodenitis, otherwise endoscopically normal. Esophageal biopsies showed reflux, gastric biopsies showed H pylori, and duodenal biopsies were normal.    Colonoscopy 04/08/21: small rectal polyp, small ascending colon polyp, left sided diverticulosis. Random colon biopsies were negative. One polyp was a tubular adenoma, the other was a hyperplastic polyp.    Completed 10 days of treatment for H pylori with tetracycline, metronidazole, and pantoprazole. Repeat testing for H pylori was still positive. He was then treated with 10 days of levofloxacin 500 mg QD, amoxicillin 1000 mg BID. There may have been some symptomatic improvement while on antibiotics but his symptoms quickly returned.    Hgb 16, MCV 75.5, RDW 13.1, platelets 241 09/14/21   H pylori stool antigen negative 12/27/21    RUQUS  IMPRESSION: 1. The liver is markedly heterogeneous particularly within the right hepatic lobe. This may be secondary to heterogeneous fatty deposition. Underlying mass is not excluded. Recommend further evaluation with contrast enhanced MRI. 2. No cholelithiasis or  sonographic evidence for acute cholecystitis. 3. These results will be called to the ordering clinician or representative by the Radiologist  Assistant, and communication documented in the PACS or [employer].    MRI  Nov 2023 IMPRESSION: 1. Probable hepatic inflammation on a background of marked geographic fatty infiltration, suggestive of steatohepatitis which can be a cause of abdominal pain. No suspicious hepatic lesion identified. Consider follow-up MRI in 6 months with and without contrast for follow-up. 2. Colonic diverticulosis without findings of acute diverticulitis.   Past Medical History:  Diagnosis Date   COVID-19    GERD (gastroesophageal reflux disease)    Giardia      Past Surgical History:  Procedure Laterality Date   COLONOSCOPY     NO PAST SURGERIES     PYLOROPLASTY     UPPER GASTROINTESTINAL ENDOSCOPY     Family History  Problem Relation Age of Onset   Hypertension Mother    Other Mother        GI complications   Diabetes Father    Colon cancer Neg Hx    Esophageal cancer Neg Hx    Rectal cancer Neg Hx    Stomach cancer Neg Hx    Social History   Tobacco Use   Smoking status: Former    Current packs/day: 0.00    Types: Cigarettes    Quit date: 2019    Years since quitting: 6.0   Smokeless tobacco: Never   Tobacco comments:    States smoked cigarettes on weekends for 7-8 years and quit smoking in 2019  Vaping Use   Vaping status: Never Used  Substance Use Topics   Alcohol use: Not Currently   Drug use: Never   Current Outpatient Medications  Medication Sig Dispense Refill   azithromycin (ZITHROMAX) 250 MG tablet Take 1 every day, starting tomorrow, for the next 4 days until finished. (Patient not taking: Reported on 05/23/2023) 4 tablet 0   dicyclomine (BENTYL) 10 MG capsule Take 1 capsule (10 mg total) by mouth 4 (four) times daily -  before meals and at bedtime. (Patient not taking: Reported on 05/23/2023) 120 capsule 3   hydrocortisone (ANUSOL-HC) 25 MG suppository Place 1 suppository (25 mg total) rectally every 12 (twelve) hours. 12 suppository 1   pantoprazole  (PROTONIX) 40 MG tablet Take 1 tablet (40 mg total) by mouth daily. (Patient not taking: Reported on 05/23/2023) 30 tablet 11   No current facility-administered medications for this visit.   No Known Allergies   Review of Systems: All systems reviewed and negative except where noted in HPI.    No results found.  Physical Exam: BP 122/80   Pulse 100   Ht 5\' 8"  (1.727 m)   Wt 240 lb (108.9 kg)   BMI 36.49 kg/m  Constitutional: Pleasant,well-developed, Hispanic male in no acute distress.  Accompanied by Spanish language interpreter. HEENT: Normocephalic and atraumatic. Conjunctivae are normal. No scleral icterus. Neck supple.  Cardiovascular: Normal rate, regular rhythm.  Pulmonary/chest: Effort normal and breath sounds normal. No wheezing, rales or rhonchi. Abdominal: Soft, slightly distended, nontender. Bowel sounds active throughout. There are no masses palpable. No hepatomegaly. Extremities: no edema Neurological: Alert and oriented to person place and time. Skin: Skin is warm and dry. No rashes noted. Psychiatric: Normal mood and affect. Behavior is normal.  CBC    Component Value Date/Time   WBC 10.2 05/23/2023 1415   WBC 7.4 01/31/2022 1541   RBC 6.68 (H) 05/23/2023 1415   RBC 6.06 (H) 01/31/2022  1541   HGB 16.8 05/23/2023 1415   HCT 51.8 (H) 05/23/2023 1415   PLT 287 05/23/2023 1415   MCV 78 (L) 05/23/2023 1415   MCH 25.1 (L) 05/23/2023 1415   MCH 26.8 09/14/2021 0711   MCHC 32.4 05/23/2023 1415   MCHC 34.1 01/31/2022 1541   RDW 14.8 05/23/2023 1415   LYMPHSABS 2.9 01/31/2022 1541   MONOABS 1.0 01/31/2022 1541   EOSABS 0.2 01/31/2022 1541   BASOSABS 0.1 01/31/2022 1541    CMP     Component Value Date/Time   NA 142 05/23/2023 1415   K 4.2 05/23/2023 1415   CL 104 05/23/2023 1415   CO2 20 05/23/2023 1415   GLUCOSE 111 (H) 05/23/2023 1415   GLUCOSE 98 09/14/2021 0711   BUN 14 05/23/2023 1415   CREATININE 1.33 (H) 05/23/2023 1415   CALCIUM 9.8  05/23/2023 1415   PROT 7.3 05/23/2023 1415   ALBUMIN 4.4 05/23/2023 1415   AST 42 (H) 05/23/2023 1415   ALT 59 (H) 05/23/2023 1415   ALKPHOS 78 05/23/2023 1415   BILITOT 0.5 05/23/2023 1415   GFRNONAA 56 (L) 09/14/2021 0711   GFRAA >60 08/21/2019 0229       Latest Ref Rng & Units 05/23/2023    2:15 PM 01/31/2022    3:41 PM 09/14/2021    7:11 AM  CBC EXTENDED  WBC 3.4 - 10.8 x10E3/uL 10.2  7.4  9.2   RBC 4.14 - 5.80 x10E6/uL 6.68  6.06  5.96   Hemoglobin 13.0 - 17.7 g/dL 40.9  81.1  91.4   HCT 37.5 - 51.0 % 51.8  46.4  45.1   Platelets 150 - 450 x10E3/uL 287  220.0  241   NEUT# 1.4 - 7.7 K/uL  3.2    Lymph# 0.7 - 4.0 K/uL  2.9        ASSESSMENT AND PLAN:  43 year old male with long-standing difficulties with abdominal pain, bloating and diarrhea, without red flag symptoms, and with normal EGD and colonoscopy, including normal colonic biopsies.  History is most consistent with IBS-D  Irritable Bowel Syndrome (IBS) We discussed the proposed pathophysiology of IBS and gut brain axis disorders in general.  We discussed management of IBS, to include use of medications to improve bowel habits, as needed pain medicine, centrally acting neuromodulators, role of empiric dietary modifications to include a low FODMAP diet gluten-free diet, as well as the role of cognitive therapies.  We discussed the goals of IBS management, namely to minimize the impact of GI symptoms on quality of life.   I recommended we start treatment with a TCA today.  I think ultimately he would benefit from Viberzi given his diarrheal frequency, but need to try TCA first.  - Prescribe Elavil 10 mg at night, plan to increase to 20 mg at bedtime after 4 weeks - Order tests to exclude celiac disease and alpha gal syndrome - Order stool tests: fecal elastase, Giardia antigen - Schedule follow-up in 6 weeks  Internal Hemorrhoids Internal hemorrhoids causing bright red blood per rectum, exacerbated by frequent bowel  movements and diarrhea. Suppositories should be used only during flare-ups. - Advise use of suppositories only during significant bleeding episodes, not exceeding two weeks - Anticipate improvement in hemorrhoidal bleeding if we can improve his diarrhea  Erectile Dysfunction Reports using an over-the-counter medication causing significant stomach pain. Advised to seek a prescription from a primary care provider as over-the-counter options may not be safe or effective. Most prescription medications for erectile  dysfunction do not typically cause gastrointestinal side effects. - Recommend establishing care with a primary care provider for appropriate erectile dysfunction medication  General Health Maintenance Advised to establish care with a primary care provider, preferably Spanish-speaking. - Provided list of Spanish-speaking primary care providers  Fatty liver - We did not have time to discuss his fatty liver during this encounter because of the complexities of discussing gut-brain axis disorder physiology through a translator, which took a long time.  We will discuss this further at his follow up and order the repeat MRI that had been previously recommended.     Destyne Goodreau E. Tomasa Rand, MD [facility]  I spent a total of 40 minutes reviewing the patient's medical record, interviewing and examining the patient, discussing her diagnosis and management of her condition going forward, and documenting in the medical record       Ivonne Andrew, NP

## 2023-05-30 NOTE — Patient Instructions (Addendum)
Your provider has requested that you go to the basement level for lab work before leaving today. Press "B" on the elevator. The lab is located at the first door on the left as you exit the elevator.  We have sent the following medications to your pharmacy for you to pick up at your convenience:  Elavil   Estela Lillie Fragmin, MD tel:[phone]  Nolon Lennert. Alvy Bimler, MD  [phone]  Sndrome de colon irritable en adultos Irritable Bowel Syndrome, Adult  El sndrome de colon irritable (SCI) se trata de un grupo de sntomas que afectan a los rganos responsables de la digestin (tubo digestivo o tracto gastrointestinal [GI]). El SCI no es Doctor, hospital. Para regular la manera en que funciona el tracto GI, el cuerpo enva seales entre los intestinos y Secretary/administrator. Si tiene SCI, puede haber un problema con estas seales. Como resultado, el tracto GI no funciona con normalidad. Los intestinos pueden volverse ms sensibles y Publishing rights manager de forma exagerada a determinadas cosas. Esto puede ocurrir especialmente cuando consume determinados alimentos o cuando est bajo estrs. Hay cuatro tipos principales de SCI. Estos tipos pueden determinarse en funcin de la consistencia de la materia fecal (heces): SCI con Heritage manager (predominio) de diarrea. SCI con predominio de estreimiento. SCI con hbitos intestinales mixtos. Esto incluye tanto diarrea como estreimiento. SCI Arboriculturist. Esto incluye el SCI que no se puede clasificar en uno de los otros tres tipos principales. Es importante saber qu tipo de SCI padece. Ciertos tratamientos pueden ser ms tiles para ciertos tipos de SCI. Cules son las causas? Se desconoce la causa exacta del SCI. Qu incrementa el riesgo? Es posible que tenga un riesgo ms alto de Marine scientist SCI si: Es mujer. Tiene menos de 40 aos. Tiene antecedentes familiares de SCI. Tiene una afeccin de salud mental, como depresin, ansiedad o trastorno  de estrs postraumtico. Tiene una infeccin bacteriana en el tracto GI. Cules son los signos o sntomas? Los sntomas de SCI pueden variar de Neomia Dear persona a Educational psychologist. El sntoma principal es el dolor o el malestar abdominal. Otros sntomas incluyen generalmente uno o ms de los siguientes: Diarrea, estreimiento, o ambos. Hinchazn o distensin en el abdomen. Sensacin de saciedad despus de comer poco o una cantidad normal de comida. Gases frecuentes. Mucosidad en las heces. Sensacin de tener ms heces por eliminar despus de una deposicin. Los sntomas suelen aparecer y Geneticist, molecular. Pueden ser causados por el estrs, una afeccin de salud mental o ciertos alimentos. Cmo se diagnostica? Esta afeccin se puede diagnosticar en funcin de un examen fsico, sus antecedentes mdicos y sus sntomas. Pueden hacerle estudios, por ejemplo: Anlisis de [place]. Prueba de heces. Colonoscopa. Este es un procedimiento en el que su tracto GI se observa con un tubo largo, delgado y flexible. Cmo se trata? No hay cura para el SCI, pero el tratamiento puede ayudar a [employer] sntomas. El tratamiento depende del tipo de SCI que tenga, y puede incluir: Cambios en la dieta, por ejemplo: Evitar los alimentos que causan sntomas. Beber ms agua. Seguir una dieta baja en FODMAP (oligosacridos, disacridos, monosacridos y polioles fermentables) por hasta 6 semanas, o segn se lo haya indicado el mdico. Los FODMAP son azcares difciles de digerir para International aid/development worker. Consumir ms fibra. Hacer comidas pequeas a la [employer]. Medicamentos. Pueden incluir: Suplementos de fibra, si est estreido. Medicamentos para Landscape architect (antidiarreicos). Medicamentos para ayudar a Acupuncturist (espasmos) en su tracto GI (antiespasmdicos). Medicamentos para ayudar  con las afecciones de salud mental, como antidepresivos. Psicoterapia o apoyo  psicolgico. Printmaker con un nutricionista para ayudar a crear un plan de alimentacin que sea adecuado para usted. Controlar el estrs. Siga estas indicaciones en su casa: Comida y bebida  Siga una dieta saludable. Coma 5 o 6 comidas pequeas por da. Trate de hacer las comidas alrededor de la misma [employer]. No coma comidas abundantes. Gradualmente introduzca a su dieta ms alimentos ricos en fibra. Estos incluyen cereales integrales, frutas y verduras. Esto puede ser especialmente til si tiene SCI con estreimiento. Siga una dieta baja en FODMAP. Es posible que deba evitar [employer] ctricos, el repollo, el ajo y las Anna. Beba suficiente lquido como para Pharmacologist la orina de color amarillo plido. Lleve un registro diario de los alimentos que parecen provocarle sntomas. Evite ingerir alimentos y bebidas que: Wellsite geologist. Empeoran sus sntomas. Estos pueden incluir productos lcteos, bebidas con cafena y bebidas con gas. Consumo de alcohol No beba alcohol si: Su mdico le indica no hacerlo. Est embarazada, puede estar embarazada o est tratando de [place]. Si bebe alcohol: Limite la cantidad que bebe a lo siguiente: De 0 a 1 medida por da para las mujeres. De 0 a 2 medidas por da para los hombres. Sepa cunta cantidad de alcohol hay en las bebidas que toma. En los [address], una medida equivale a una botella de cerveza de 12 oz (355 ml), un vaso de vino de 5 oz (148 ml) o un vaso de una bebida alcohlica de alta graduacin de 1 oz (44 ml). Indicaciones generales Use los medicamentos de venta libre y los recetados solamente como se lo haya indicado el mdico. Esto incluye los suplementos. Ejerctate lo suficiente. Debe realizar al menos 150 minutos de ejercicios de intensidad moderada todas las semanas. Controle el estrs. Dormir lo suficiente y Radio producer ejercicio pueden ayudarlo a Charity fundraiser. Concurra a todas las  visitas de seguimiento. Esto es importante. Esto incluye todas las visitas al mdico y al terapeuta. Dnde obtener ms informacin Arts development officer for [employer] Gastrointestinal Disorders (Fundacin Internacional para los Trastornos Gastrointestinales Funcionales): aboutibs.[employer] of Diabetes and Digestive and Kidney Diseases [employer] de la Diabetes y las Enfermedades Digestivas y Renales): [employer].si Comunquese con un mdico si: Engineer, maintenance (IT). Pierde peso. Tiene diarrea que empeora. Tiene hemorragia rectal. Vomita con frecuencia. Tiene fiebre. Solicite ayuda de inmediato si: Siente un dolor abdominal intenso. Tiene diarrea con sntomas de deshidratacin, como mareos o sequedad en la boca. Tiene deposiciones negras o con sangre. Tiene distensin abdominal intensa. Tiene vmitos que no se detienen. Observa sangre en el vmito. Resumen El sndrome de colon irritable (SCI) no es Doctor, hospital. Es un grupo de sntomas que afectan la digestin. Los intestinos pueden volverse ms sensibles y Publishing rights manager de forma exagerada a determinadas cosas. Esto puede ocurrir especialmente cuando consume determinados alimentos o cuando est bajo estrs. No hay cura para el SCI, pero el tratamiento puede ayudar a [employer] sntomas. Esta informacin no tiene Theme park manager el consejo del mdico. Asegrese de hacerle al mdico cualquier pregunta que tenga. Document Revised: 05/06/2021 Document Reviewed: 05/06/2021 Elsevier Patient Education  2024 [employer].

## 2023-06-01 ENCOUNTER — Other Ambulatory Visit: Payer: Self-pay

## 2023-06-01 DIAGNOSIS — K580 Irritable bowel syndrome with diarrhea: Secondary | ICD-10-CM

## 2023-06-02 LAB — GIARDIA ANTIGEN
MICRO NUMBER:: 15989059
RESULT:: NOT DETECTED
SPECIMEN QUALITY:: ADEQUATE

## 2023-06-02 LAB — ALPHA-GAL PANEL
Allergen, Mutton, f88: <0.1 kU/L
Allergen, Pork, f26: <0.1 kU/L
Beef: <0.1 kU/L
CLASS: 0
CLASS: 0
Class: 0
GALACTOSE-ALPHA-1,3-GALACTOSE IGE*: <0.1 kU/L (ref <0.10)

## 2023-06-02 LAB — INTERPRETATION:

## 2023-06-02 LAB — TISSUE TRANSGLUTAMINASE, IGA: (tTG) Ab, IgA: <1 U/mL

## 2023-06-02 LAB — IGA: Immunoglobulin A: 248 mg/dL (ref 47–310)

## 2023-06-07 LAB — PANCREATIC ELASTASE, FECAL: Pancreatic Elastase-1, Stool: 730 ug/g (ref >200)

## 2023-06-07 NOTE — Progress Notes (Signed)
Bonita Quin, Please let Mr. Preslar know that his blood test for alpha gal syndrome and celiac disease were normal.  Also, his stool test for pancreatic insufficiency and Giardia were negative.  Please have him continues to take Elavil 10 mg at night for 4 weeks, increase to 20 mg after that.  Follow-up with me in April as scheduled.  Patient will need medical interpreter to relay these results.

## 2023-06-09 NOTE — Progress Notes (Signed)
Mr. Kolton, Your stool test looking for Giardia (a parasite that can cause chronic diarrhea) and the stool test looking for pancreatic insufficiency were all normal.  Please continue taking the Elavil as previously discussed and follow-up with me in the clinic.  Bonita Quin, Can you please relay the above message to Mr. Bhat?

## 2023-08-09 ENCOUNTER — Ambulatory Visit: Payer: Self-pay | Admitting: Gastroenterology

## 2023-08-09 NOTE — Progress Notes (Deleted)
 HPI :    Past Medical History:  Diagnosis Date   COVID-19    GERD (gastroesophageal reflux disease)    Giardia      Past Surgical History:  Procedure Laterality Date   COLONOSCOPY     NO PAST SURGERIES     PYLOROPLASTY     UPPER GASTROINTESTINAL ENDOSCOPY     Family History  Problem Relation Age of Onset   Hypertension Mother    Other Mother        GI complications   Diabetes Father    Colon cancer Neg Hx    Esophageal cancer Neg Hx    Rectal cancer Neg Hx    Stomach cancer Neg Hx    Social History   Tobacco Use   Smoking status: Former    Current packs/day: 0.00    Types: Cigarettes    Quit date: 2019    Years since quitting: 6.2   Smokeless tobacco: Never   Tobacco comments:    States smoked cigarettes on weekends for 7-8 years and quit smoking in 2019  Vaping Use   Vaping status: Never Used  Substance Use Topics   Alcohol use: Not Currently   Drug use: Never   Current Outpatient Medications  Medication Sig Dispense Refill   amitriptyline (ELAVIL) 10 MG tablet Take 1 tablet (10 mg total) by mouth at bedtime. 90 tablet 1   azithromycin (ZITHROMAX) 250 MG tablet Take 1 every day, starting tomorrow, for the next 4 days until finished. (Patient not taking: Reported on 05/30/2023) 4 tablet 0   dicyclomine (BENTYL) 10 MG capsule Take 1 capsule (10 mg total) by mouth 4 (four) times daily -  before meals and at bedtime. (Patient not taking: Reported on 05/30/2023) 120 capsule 3   hydrocortisone (ANUSOL-HC) 25 MG suppository Place 1 suppository (25 mg total) rectally every 12 (twelve) hours. (Patient not taking: Reported on 05/30/2023) 12 suppository 1   omeprazole (PRILOSEC) 10 MG capsule Take 10 mg by mouth daily.     OVER THE COUNTER MEDICATION Pt taking curan for a cold     pantoprazole (PROTONIX) 40 MG tablet Take 1 tablet (40 mg total) by mouth daily. (Patient not taking: Reported on 05/30/2023) 30 tablet 11   No current facility-administered medications for  this visit.   No Known Allergies   Review of Systems: All systems reviewed and negative except where noted in HPI.    No results found.  Physical Exam: There were no vitals taken for this visit. Constitutional: Pleasant,well-developed, ***male in no acute distress. HEENT: Normocephalic and atraumatic. Conjunctivae are normal. No scleral icterus. Neck supple.  Cardiovascular: Normal rate, regular rhythm.  Pulmonary/chest: Effort normal and breath sounds normal. No wheezing, rales or rhonchi. Abdominal: Soft, nondistended, nontender. Bowel sounds active throughout. There are no masses palpable. No hepatomegaly. Extremities: no edema Lymphadenopathy: No cervical adenopathy noted. Neurological: Alert and oriented to person place and time. Skin: Skin is warm and dry. No rashes noted. Psychiatric: Normal mood and affect. Behavior is normal.  CBC    Component Value Date/Time   WBC 10.2 05/23/2023 1415   WBC 7.4 01/31/2022 1541   RBC 6.68 (H) 05/23/2023 1415   RBC 6.06 (H) 01/31/2022 1541   HGB 16.8 05/23/2023 1415   HCT 51.8 (H) 05/23/2023 1415   PLT 287 05/23/2023 1415   MCV 78 (L) 05/23/2023 1415   MCH 25.1 (L) 05/23/2023 1415   MCH 26.8 09/14/2021 0711   MCHC 32.4 05/23/2023 1415   MCHC  34.1 01/31/2022 1541   RDW 14.8 05/23/2023 1415   LYMPHSABS 2.9 01/31/2022 1541   MONOABS 1.0 01/31/2022 1541   EOSABS 0.2 01/31/2022 1541   BASOSABS 0.1 01/31/2022 1541    CMP     Component Value Date/Time   NA 142 05/23/2023 1415   K 4.2 05/23/2023 1415   CL 104 05/23/2023 1415   CO2 20 05/23/2023 1415   GLUCOSE 111 (H) 05/23/2023 1415   GLUCOSE 98 09/14/2021 0711   BUN 14 05/23/2023 1415   CREATININE 1.33 (H) 05/23/2023 1415   CALCIUM 9.8 05/23/2023 1415   PROT 7.3 05/23/2023 1415   ALBUMIN 4.4 05/23/2023 1415   AST 42 (H) 05/23/2023 1415   ALT 59 (H) 05/23/2023 1415   ALKPHOS 78 05/23/2023 1415   BILITOT 0.5 05/23/2023 1415   GFRNONAA 56 (L) 09/14/2021 0711   GFRAA  >60 08/21/2019 0229       Latest Ref Rng & Units 05/23/2023    2:15 PM 01/31/2022    3:41 PM 09/14/2021    7:11 AM  CBC EXTENDED  WBC 3.4 - 10.8 x10E3/uL 10.2  7.4  9.2   RBC 4.14 - 5.80 x10E6/uL 6.68  6.06  5.96   Hemoglobin 13.0 - 17.7 g/dL 16.1  09.6  04.5   HCT 37.5 - 51.0 % 51.8  46.4  45.1   Platelets 150 - 450 x10E3/uL 287  220.0  241   NEUT# 1.4 - 7.7 K/uL  3.2    Lymph# 0.7 - 4.0 K/uL  2.9        ASSESSMENT AND PLAN:  No ref. provider found

## 2023-08-21 ENCOUNTER — Encounter: Payer: Self-pay | Admitting: Nurse Practitioner

## 2023-10-06 IMAGING — DX DG LUMBAR SPINE COMPLETE 4+V
5 series · 5 of 5 positions shown · non-contrast
Comparison: Lumbar spine radiograph 04/25/2020.

CLINICAL DATA: Chronic low back pain, left leg pain progressively
worsening.

EXAM:
LUMBAR SPINE - COMPLETE 4+ VIEW

[l-spine ap]
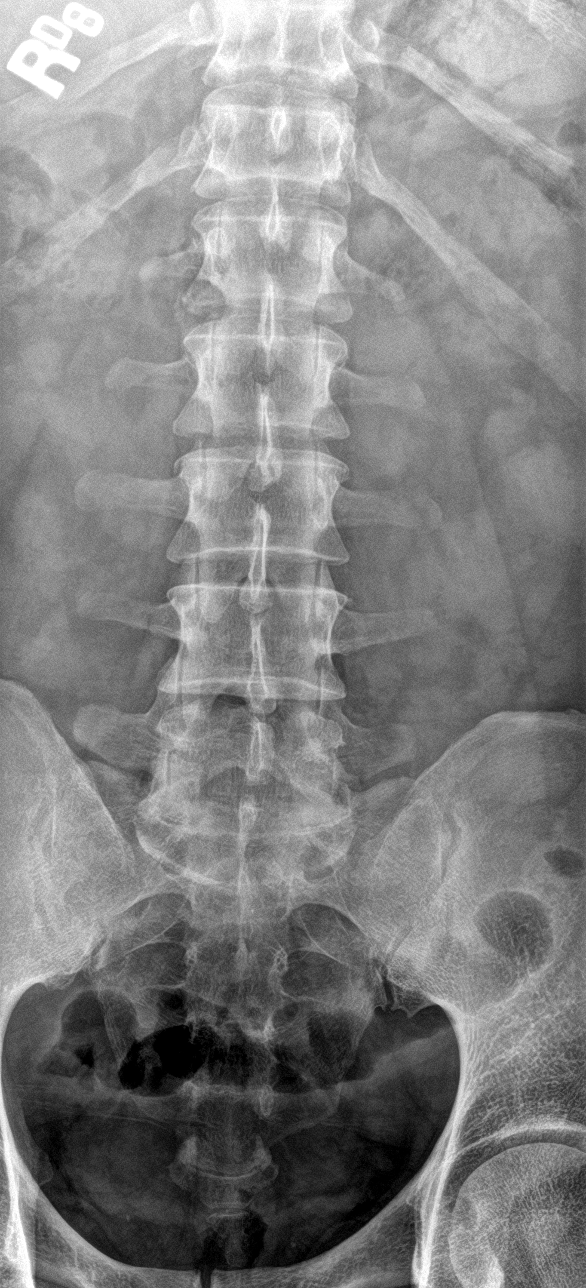

[l-spine obl (1 of 2)]
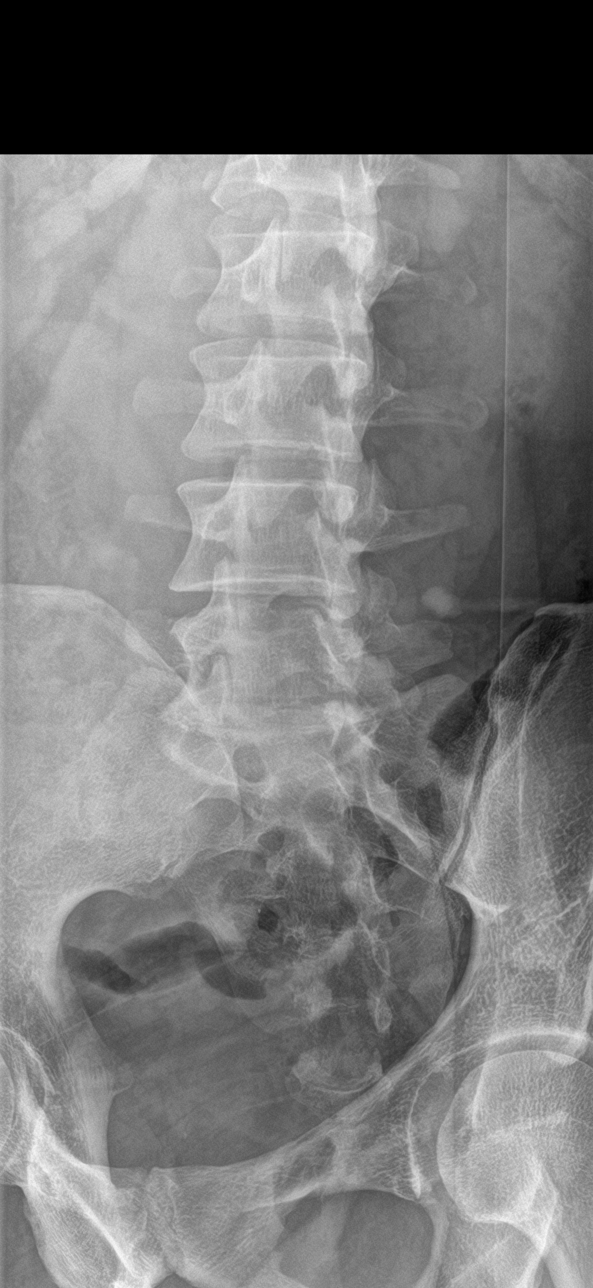

[l-spine obl (2 of 2)]
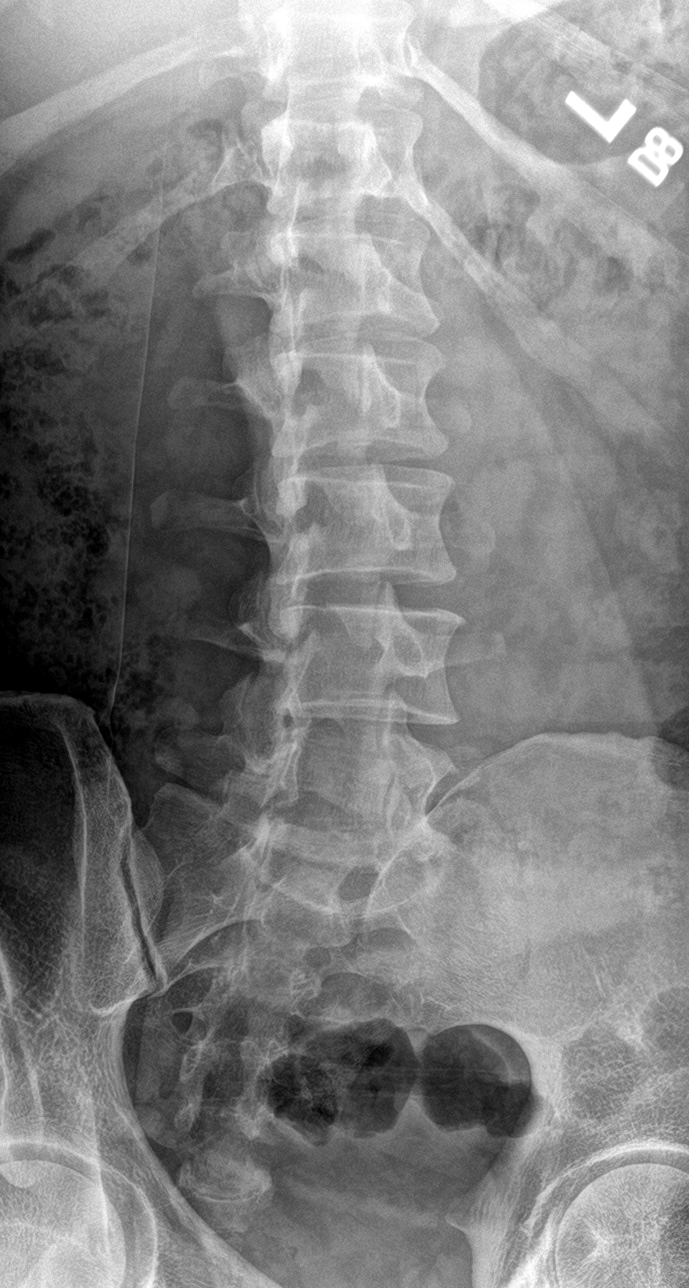

[l-spine lat]
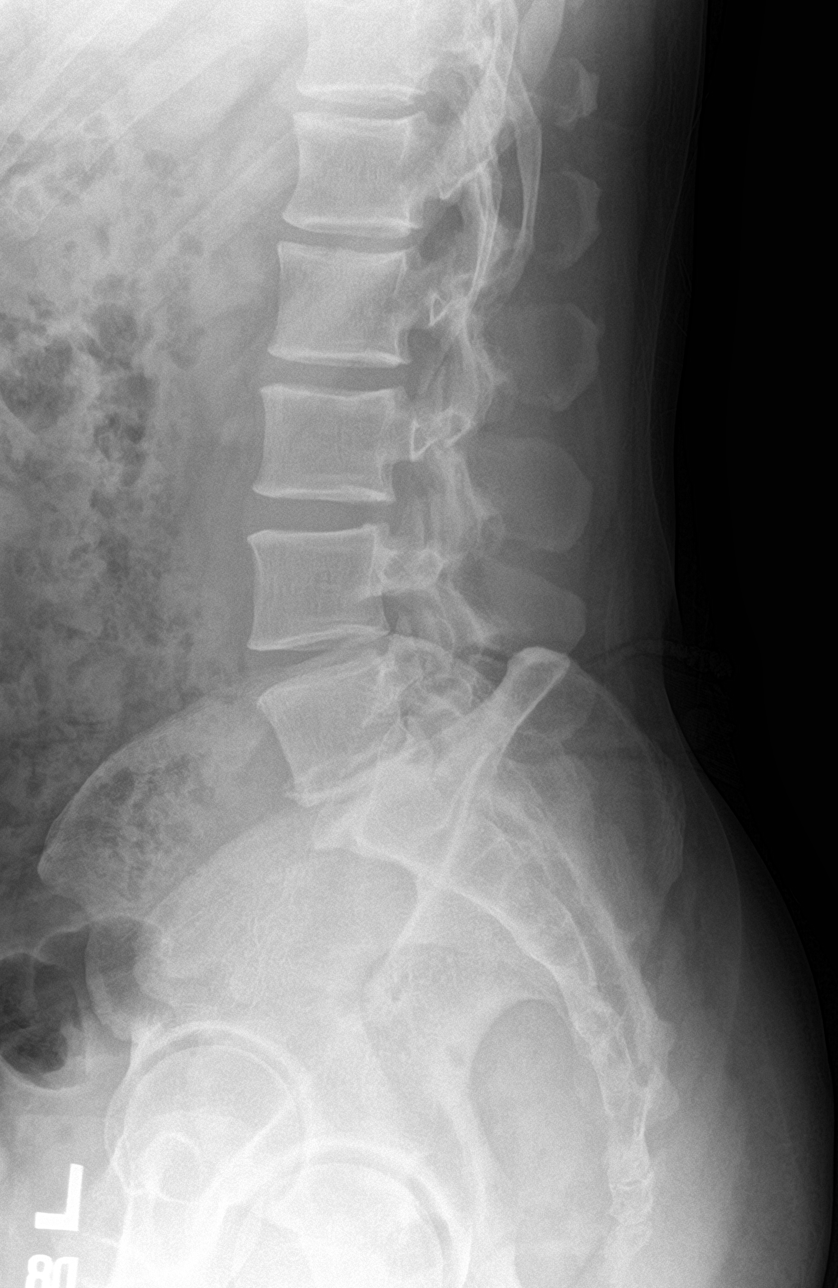

[l-spine spot]
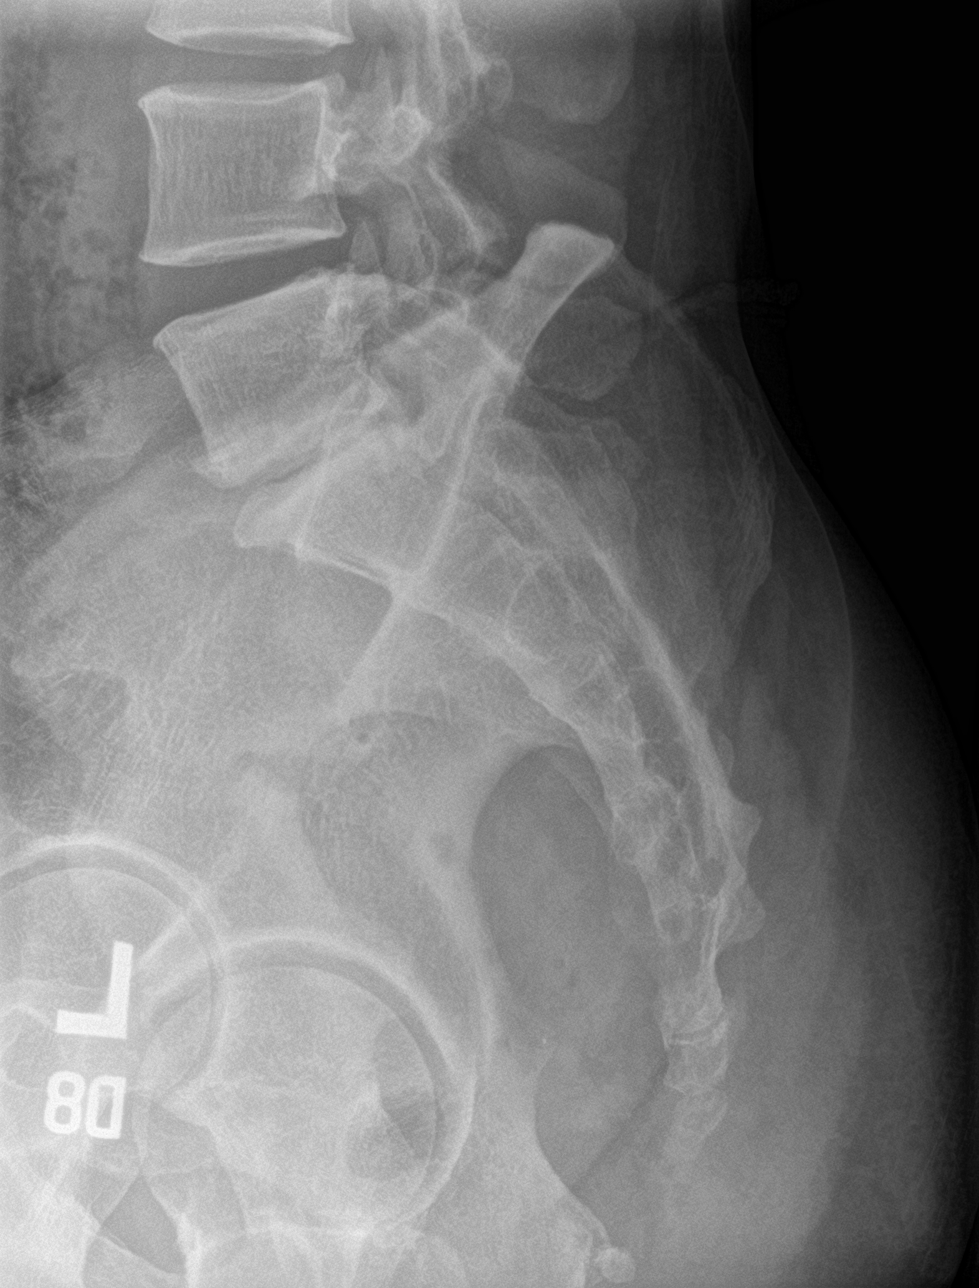

[5 of 5 positions shown; findings below may reference images not displayed]

FINDINGS: There are 5 lumbar type vertebral bodies. There is no evidence of
lumbar spine fracture. Alignment is normal. Advanced degenerative
disc disease at L5-S1. Intervertebral disc space height is otherwise
maintained. Interval neural foraminal narrowing at L4-L5 and likely
L5-S1.
IMPRESSION: No fracture or traumatic malalignment.

Advanced degenerative disc disease at L5-S1. Interval neural
foraminal narrowing at L4-L5 and likely L5-S1. Consider MRI of the
lumbar spine for further evaluation.
